# Patient Record
Sex: Female | Born: 1955 | Hispanic: Yes | Marital: Married | Smoking: Never smoker
Health system: Southern US, Community
[De-identification: ages and names within clinical notes are randomized; demographics above are authoritative.]

## PROBLEM LIST (undated history)

## (undated) HISTORY — PX: SHOULDER SURGERY: SHX246

---

## 2017-06-15 ENCOUNTER — Encounter (HOSPITAL_COMMUNITY): Payer: Self-pay | Admitting: *Deleted

## 2017-06-15 ENCOUNTER — Emergency Department (HOSPITAL_COMMUNITY)
Admission: EM | Admit: 2017-06-15 | Discharge: 2017-06-15 | Disposition: A | Discharge status: Home / Self Care | Payer: Self-pay | Attending: Emergency Medicine | Admitting: Emergency Medicine

## 2017-06-15 DIAGNOSIS — H6503 Acute serous otitis media, bilateral: Secondary | ICD-10-CM | POA: Insufficient documentation

## 2017-06-15 DIAGNOSIS — R0981 Nasal congestion: Secondary | ICD-10-CM | POA: Insufficient documentation

## 2017-06-15 NOTE — Discharge Instructions (Signed)
Please consider taking a daily allergy medication to help with your symptoms.  I suggest a less drowsy 24 hour medication such as allegra, zyrtec or Claritin or the generic version.    Please also start using nasacort  or flonase nasal sprays.    Elegir uno: Allegra, Zyrtec, Claritin Y tambien Elegir uno: Nasacort, or flonase

## 2017-06-15 NOTE — ED Triage Notes (Signed)
Pt complains of right ear pain for the past month. Pt denies hearing loss, drainage from ear, or injury to ear. Pt states it feels like something is moving inside her ear. Pt denies sinus issues.

## 2017-06-15 NOTE — ED Provider Notes (Signed)
[facility]-EMERGENCY DEPT Provider Note   CSN: [id number] Arrival date & time: 06/15/17  1722     History   Chief Complaint Chief Complaint  Patient presents with  . Otalgia    HPI Deborah Lee is a 62 y.o. female with no significant past medical history who presents today for evaluation of right ear pain.  She reports that she feels like something moving in her ear.  She reports that this has been worsening over the past month.  She does not have a primary care provider.  She denies any headaches or ringing in her ears.  No changes to her hearing.  HPI  History reviewed. No pertinent past medical history.  There are no active problems to display for this patient.   History reviewed. No pertinent surgical history.   OB History   None      Home Medications    Prior to Admission medications   Not on File    Family History No family history on file.  Social History Social History   Tobacco Use  . Smoking status: Never Smoker  . Smokeless tobacco: Never Used  Substance Use Topics  . Alcohol use: Not Currently  . Drug use: Not Currently     Allergies   Patient has no allergy information on record.   Review of Systems Review of Systems  Constitutional: Negative for chills and fever.  HENT: Positive for ear pain. Negative for congestion, ear discharge, facial swelling, hearing loss, sinus pain, sneezing and sore throat.   Respiratory: Negative for cough and shortness of breath.   Neurological: Negative for headaches.  All other systems reviewed and are negative.    Physical Exam Updated Vital Signs BP (!) 147/74 (BP Location: Left Arm)   Pulse 68   Temp 98.5 F (36.9 C) (Oral)   Resp 18   SpO2 96%   Physical Exam  Constitutional: She appears well-developed and well-nourished.  HENT:  Head: Normocephalic and atraumatic.  Right Ear: Tympanic membrane, external ear and ear canal normal.  Left Ear: Tympanic membrane,  external ear and ear canal normal.  Nose: Mucosal edema present.  Mouth/Throat: Oropharynx is clear and moist. No oropharyngeal exudate.  Bilateral serous effusions, right worse than left.  Eyes: Conjunctivae are normal.  Neck: Normal range of motion. Neck supple.  Pulmonary/Chest: Effort normal. No respiratory distress.  Lymphadenopathy:    She has no cervical adenopathy.  Neurological: She is alert.  Skin: Skin is warm and dry. She is not diaphoretic.  Nursing note and vitals reviewed.    ED Treatments / Results  Labs (all labs ordered are listed, but only abnormal results are displayed) Labs Reviewed - No data to display  EKG None  Radiology No results found.  Procedures Procedures (including critical care time)  Medications Ordered in ED Medications - No data to display   Initial Impression / Assessment and Plan / ED Course  I have reviewed the triage vital signs and the nursing notes.  Pertinent labs & imaging results that were available during my care of the patient were reviewed by me and considered in my medical decision making (see chart for details).    Patient presents today for evaluation of pain in her right ear and feeling like something is moving around which has been worsening over the past month.  Physical exam reveals bilateral serous otitis media's, right worse than left.  She also has mild mucosal edema.  Given time of year and symptom progression  most likely correlates with seasonal allergies.  She was instructed to start taking a second-generation antihistamine along with using over-the-counter steroid-based nasal sprays.  She was given follow-up with ear nose and throat if she does not have a primary care provider and instructed that if her symptoms do not start improving within 1 to 2 weeks that she may follow-up with them.  Patient was given the option to ask questions, all of which were answered to the best of my abilities.  Return precautions were  discussed.  Patient discharged home.  Hemodynamically stable at time of discharge.  Final Clinical Impressions(s) / ED Diagnoses   Final diagnoses:  Non-recurrent acute serous otitis media of both ears    ED Discharge Orders    None       Norman ClayHammond, Kristell Wooding W, PA-C 06/15/17 2302    Jacalyn LefevreHaviland, Julie, MD 06/15/17 2350

## 2017-06-15 NOTE — ED Notes (Signed)
Pt is alert an oriented x 4 and is verbally responsive Pt reports that she has rt ear discomfort that fells full/heavy. Pt does not report pain at this time.

## 2018-12-10 ENCOUNTER — Other Ambulatory Visit: Payer: Self-pay

## 2018-12-10 ENCOUNTER — Encounter (HOSPITAL_COMMUNITY): Payer: Self-pay | Admitting: *Deleted

## 2018-12-10 ENCOUNTER — Emergency Department (HOSPITAL_COMMUNITY): Payer: BC Managed Care – PPO

## 2018-12-10 ENCOUNTER — Encounter (HOSPITAL_COMMUNITY): Payer: Self-pay | Admitting: Physician Assistant

## 2018-12-10 ENCOUNTER — Observation Stay (HOSPITAL_COMMUNITY)
Admission: EM | Admit: 2018-12-10 | Discharge: 2018-12-12 | Disposition: A | Discharge status: Home / Self Care | Payer: BC Managed Care – PPO | Attending: Internal Medicine | Admitting: Internal Medicine

## 2018-12-10 ENCOUNTER — Emergency Department (HOSPITAL_COMMUNITY)
Admission: EM | Admit: 2018-12-10 | Discharge: 2018-12-10 | Disposition: A | Discharge status: Home / Self Care | Payer: BC Managed Care – PPO | Source: Home / Self Care | Attending: Emergency Medicine | Admitting: Emergency Medicine

## 2018-12-10 DIAGNOSIS — Z79899 Other long term (current) drug therapy: Secondary | ICD-10-CM | POA: Insufficient documentation

## 2018-12-10 DIAGNOSIS — M7072 Other bursitis of hip, left hip: Principal | ICD-10-CM | POA: Insufficient documentation

## 2018-12-10 DIAGNOSIS — R739 Hyperglycemia, unspecified: Secondary | ICD-10-CM | POA: Diagnosis not present

## 2018-12-10 DIAGNOSIS — R935 Abnormal findings on diagnostic imaging of other abdominal regions, including retroperitoneum: Secondary | ICD-10-CM

## 2018-12-10 DIAGNOSIS — R1032 Left lower quadrant pain: Secondary | ICD-10-CM | POA: Insufficient documentation

## 2018-12-10 DIAGNOSIS — E119 Type 2 diabetes mellitus without complications: Secondary | ICD-10-CM

## 2018-12-10 DIAGNOSIS — M7070 Other bursitis of hip, unspecified hip: Secondary | ICD-10-CM

## 2018-12-10 DIAGNOSIS — Z20828 Contact with and (suspected) exposure to other viral communicable diseases: Secondary | ICD-10-CM | POA: Diagnosis not present

## 2018-12-10 DIAGNOSIS — M25559 Pain in unspecified hip: Secondary | ICD-10-CM | POA: Diagnosis present

## 2018-12-10 LAB — CBC
HCT: 43.6 % (ref 36.0–46.0)
Hemoglobin: 14.6 g/dL (ref 12.0–15.0)
MCH: 32.7 pg (ref 26.0–34.0)
MCHC: 33.5 g/dL (ref 30.0–36.0)
MCV: 97.8 fL (ref 80.0–100.0)
Platelets: 217 10*3/uL (ref 150–400)
RBC: 4.46 MIL/uL (ref 3.87–5.11)
RDW: 12.3 % (ref 11.5–15.5)
WBC: 7.9 10*3/uL (ref 4.0–10.5)
nRBC: 0 % (ref 0.0–0.2)

## 2018-12-10 LAB — COMPREHENSIVE METABOLIC PANEL
ALT: 18 U/L (ref 0–44)
AST: 22 U/L (ref 15–41)
Albumin: 4.4 g/dL (ref 3.5–5.0)
Alkaline Phosphatase: 94 U/L (ref 38–126)
Anion gap: 10 (ref 5–15)
BUN: 15 mg/dL (ref 6–20)
CO2: 25 mmol/L (ref 22–32)
Calcium: 9.2 mg/dL (ref 8.9–10.3)
Chloride: 104 mmol/L (ref 98–111)
Creatinine, Ser: 0.63 mg/dL (ref 0.44–1.00)
GFR calc Af Amer: >60 mL/min (ref >60)
GFR calc non Af Amer: >60 mL/min (ref >60)
Glucose, Bld: 185 mg/dL — ABNORMAL HIGH (ref 70–99)
Potassium: 4.1 mmol/L (ref 3.5–5.1)
Sodium: 139 mmol/L (ref 135–145)
Total Bilirubin: 0.7 mg/dL (ref 0.3–1.2)
Total Protein: 7.3 g/dL (ref 6.5–8.1)

## 2018-12-10 LAB — URINALYSIS, ROUTINE W REFLEX MICROSCOPIC
Bilirubin Urine: NEGATIVE
Glucose, UA: NEGATIVE mg/dL
Hgb urine dipstick: NEGATIVE
Ketones, ur: NEGATIVE mg/dL
Leukocytes,Ua: NEGATIVE
Nitrite: NEGATIVE
Protein, ur: NEGATIVE mg/dL
Specific Gravity, Urine: 1.005 (ref 1.005–1.030)
pH: 6 (ref 5.0–8.0)

## 2018-12-10 LAB — LIPASE, BLOOD: Lipase: 34 U/L (ref 11–51)

## 2018-12-10 LAB — I-STAT BETA HCG BLOOD, ED (MC, WL, AP ONLY): I-stat hCG, quantitative: <5 m[IU]/mL (ref <5)

## 2018-12-10 MED ORDER — GADOBUTROL 1 MMOL/ML IV SOLN
5.0000 mL | Freq: Once | INTRAVENOUS | Status: AC | PRN
  Administered 2018-12-10: 5 mL via INTRAVENOUS

## 2018-12-10 MED ORDER — ACETAMINOPHEN 650 MG RE SUPP: 650.0000 mg | Freq: Four times a day (QID) | RECTAL | Status: DC | PRN

## 2018-12-10 MED ORDER — ACETAMINOPHEN 325 MG PO TABS: 650.0000 mg | ORAL_TABLET | Freq: Four times a day (QID) | ORAL | Status: DC | PRN

## 2018-12-10 MED ORDER — SODIUM CHLORIDE 0.9 % IV SOLN
INTRAVENOUS | Status: AC
  Administered 2018-12-11: 01:00:00 via INTRAVENOUS

## 2018-12-10 MED ORDER — SODIUM CHLORIDE 0.9% FLUSH: 3.0000 mL | Freq: Once | INTRAVENOUS | Status: DC

## 2018-12-10 MED ORDER — IOHEXOL 300 MG/ML  SOLN
100.0000 mL | Freq: Once | INTRAMUSCULAR | Status: AC | PRN
  Administered 2018-12-10: 16:00:00 100 mL via INTRAVENOUS

## 2018-12-10 MED ORDER — SODIUM CHLORIDE (PF) 0.9 % IJ SOLN
INTRAMUSCULAR | Status: AC
  Filled 2018-12-10: qty 50

## 2018-12-10 NOTE — ED Provider Notes (Signed)
Patient presents today in transfer from [place] [facility] for [place].  According to note from provider at [facility] patient was woken up this morning at about 4 AM by left abdominal and suprapubic pain.  Chart review shows that at [facility] an MRI was obtained showing concern for a left hip/psoas muscle fluid collection with the recommendation per radiology of obtaining MRI with and without contrast of the left hip for further evaluation. Physical Exam  BP 135/67   Pulse 69   Temp 98.1 F (36.7 C) (Oral)   Resp 14   SpO2 100%   Physical Exam Vitals signs and nursing note reviewed.  Constitutional:      General: She is not in acute distress.    Appearance: She is well-developed. She is not diaphoretic.  HENT:     Head: Normocephalic and atraumatic.  Eyes:     General: No scleral icterus.       Right eye: No discharge.        Left eye: No discharge.     Conjunctiva/sclera: Conjunctivae normal.  Neck:     Musculoskeletal: Normal range of motion.  Cardiovascular:     Rate and Rhythm: Normal rate and regular rhythm.  Pulmonary:     Effort: Pulmonary effort is normal. No respiratory distress.     Breath sounds: No stridor.  Abdominal:     General: There is no distension.  Musculoskeletal:        General: No deformity.  Skin:    General: Skin is warm and dry.  Neurological:     Mental Status: She is alert.     Motor: No abnormal muscle tone.     Gait: Gait normal.  Psychiatric:        Mood and Affect: Mood normal.        Behavior: Behavior normal.     ED Course/Procedures   Clinical Course as of Dec 11 6  Sat Dec 10, 2018  2134 Spoke with Ortho who recommended clarifying if in the joint or not.Radiologist reports that the fluid collection does not extend into the joint.Per discussions with Dr. Percell Miller plan to admit for IR to tap the bursa.   [EH]    Clinical Course User Index [EH] Ollen Gross    Procedures Mr Hip Left W Wo Contrast  Result Date:  12/10/2018 CLINICAL DATA:  Hip swelling, infection suspected EXAM: MRI OF THE LEFT HIP WITHOUT AND WITH CONTRAST TECHNIQUE: Multiplanar, multisequence MR imaging was performed both before and after administration of intravenous contrast. CONTRAST:  20mL GADAVIST GADOBUTROL 1 MMOL/ML IV SOLN COMPARISON:  None. FINDINGS: Bones: There is no evidence of acute fracture, dislocation or avascular necrosis. The femoral head is normal in shape, configuration and sphericity. No osseous bump at the femoral head neck junction. The visualized bony pelvis appears normal. The visualized sacroiliac joints and symphysis pubis appear normal. Articular cartilage and labrum Articular cartilage: There is mild superior joint space loss with chondral thinning. Labrum: There is no gross labral tear or paralabral abnormality. Joint or bursal effusion Joint effusion: There is a small hip joint effusion. Bursae: Extending superiorly from the superior joint space within the iliopsoas bursa there is a complex multi-septated peripherally enhancing collection. The collection measures approximately 3.6 x 2.3 x 4.7 cm. There is scattered debris seen within the collection. The collection extends to the left lesser trochanter. Muscles and tendons Muscles and tendons: The visualized gluteus, hamstring and iliopsoas tendons appear normal. The piriformis muscles appear symmetric. Other  findings Miscellaneous: The visualized internal pelvic contents appear unremarkable. IMPRESSION: Complex iliopsoas bursitis measuring approximately 3.6 x 2.3 x 4.7 cm. Electronically Signed   By: Jonna Clark M.D.   On: 12/10/2018 20:56   Ct Abdomen Pelvis W Contrast  Result Date: 12/10/2018 CLINICAL DATA:  Acute abdominal pain in the left abdomen/flank. EXAM: CT ABDOMEN AND PELVIS WITH CONTRAST TECHNIQUE: Multidetector CT imaging of the abdomen and pelvis was performed using the standard protocol following bolus administration of intravenous contrast. CONTRAST:   OMNIPAQUE IOHEXOL 300 MG/ML  SOLN COMPARISON:  None. FINDINGS: Lower chest: No significant pulmonary nodules or acute consolidative airspace disease. Hepatobiliary: Normal liver size. No liver mass. Normal gallbladder with no radiopaque cholelithiasis. No biliary ductal dilatation. Tiny periampullary duodenal diverticulum. Pancreas: Normal, with no mass or duct dilation. Spleen: Normal size. No mass. Adrenals/Urinary Tract: Normal adrenals. Subcentimeter hypodense renal cortical lesion in the upper left kidney is too small to characterize and requires no follow-up. Otherwise normal kidneys, with no hydronephrosis. Normal bladder. Stomach/Bowel: Normal non-distended stomach. Normal caliber small bowel with no small bowel wall thickening. Normal appendix. Scattered mild colonic diverticulosis, with no large bowel wall thickening or significant pericolonic fat stranding. Vascular/Lymphatic: Atherosclerotic nonaneurysmal abdominal aorta. Patent portal, splenic, hepatic and renal veins. No pathologically enlarged lymph nodes in the abdomen or pelvis. Reproductive: Mildly enlarged myomatous uterus with dominant 2.9 cm anterior left uterine fibroid. No adnexal mass. Other: No pneumoperitoneum, ascites or intra-abdominal fluid collection. Musculoskeletal: No aggressive appearing focal osseous lesions. There is a low-attenuation 3.5 x 2.1 x 4.3 cm collection along the lateral margin of left iliopsoas muscle anterior to the left femoral neck (series 2/image 69). Mild lumbar spondylosis. IMPRESSION: 1. Low-attenuation 3.5 x 2.1 x 4.3 cm collection along the lateral margin of the left iliopsoas muscle anterior to the left femoral neck, potentially a bursal collection. Infected collection cannot be excluded. MRI of the left hip without and with IV contrast may be obtained for further evaluation as clinically warranted. 2. No evidence of bowel obstruction or acute bowel inflammation. Mild colonic diverticulosis, with no evidence  of acute diverticulitis. No hydronephrosis. 3. Mildly enlarged myomatous uterus. 4.  Aortic Atherosclerosis (ICD10-I70.0). Electronically Signed   By: Delbert Phenix M.D.   On: 12/10/2018 16:20   Results for orders placed or performed during the hospital encounter of 12/10/18  Lipase, blood  Result Value Ref Range   Lipase 34 11 - 51 U/L  Comprehensive metabolic panel  Result Value Ref Range   Sodium 139 135 - 145 mmol/L   Potassium 4.1 3.5 - 5.1 mmol/L   Chloride 104 98 - 111 mmol/L   CO2 25 22 - 32 mmol/L   Glucose, Bld 185 (H) 70 - 99 mg/dL   BUN 15 6 - 20 mg/dL   Creatinine, Ser 1.61 0.44 - 1.00 mg/dL   Calcium 9.2 8.9 - 09.6 mg/dL   Total Protein 7.3 6.5 - 8.1 g/dL   Albumin 4.4 3.5 - 5.0 g/dL   AST 22 15 - 41 U/L   ALT 18 0 - 44 U/L   Alkaline Phosphatase 94 38 - 126 U/L   Total Bilirubin 0.7 0.3 - 1.2 mg/dL   GFR calc non Af Amer >60 >60 mL/min   GFR calc Af Amer >60 >60 mL/min   Anion gap 10 5 - 15  CBC  Result Value Ref Range   WBC 7.9 4.0 - 10.5 K/uL   RBC 4.46 3.87 - 5.11 MIL/uL   Hemoglobin 14.6 12.0 -  15.0 g/dL   HCT 16.143.6 09.636.0 - 04.546.0 %   MCV 97.8 80.0 - 100.0 fL   MCH 32.7 26.0 - 34.0 pg   MCHC 33.5 30.0 - 36.0 g/dL   RDW 40.912.3 81.111.5 - 91.415.5 %   Platelets 217 150 - 400 K/uL   nRBC 0.0 0.0 - 0.2 %  Urinalysis, Routine w reflex microscopic  Result Value Ref Range   Color, Urine STRAW (A) YELLOW   APPearance CLEAR CLEAR   Specific Gravity, Urine 1.005 1.005 - 1.030   pH 6.0 5.0 - 8.0   Glucose, UA NEGATIVE NEGATIVE mg/dL   Hgb urine dipstick NEGATIVE NEGATIVE   Bilirubin Urine NEGATIVE NEGATIVE   Ketones, ur NEGATIVE NEGATIVE mg/dL   Protein, ur NEGATIVE NEGATIVE mg/dL   Nitrite NEGATIVE NEGATIVE   Leukocytes,Ua NEGATIVE NEGATIVE  I-Stat beta hCG blood, ED  Result Value Ref Range   I-stat hCG, quantitative <5.0 <5 mIU/mL   Comment 3           Mr Hip Left W Wo Contrast  Result Date: 12/10/2018 CLINICAL DATA:  Hip swelling, infection suspected EXAM: MRI OF  THE LEFT HIP WITHOUT AND WITH CONTRAST TECHNIQUE: Multiplanar, multisequence MR imaging was performed both before and after administration of intravenous contrast. CONTRAST:  5mL GADAVIST GADOBUTROL 1 MMOL/ML IV SOLN COMPARISON:  None. FINDINGS: Bones: There is no evidence of acute fracture, dislocation or avascular necrosis. The femoral head is normal in shape, configuration and sphericity. No osseous bump at the femoral head neck junction. The visualized bony pelvis appears normal. The visualized sacroiliac joints and symphysis pubis appear normal. Articular cartilage and labrum Articular cartilage: There is mild superior joint space loss with chondral thinning. Labrum: There is no gross labral tear or paralabral abnormality. Joint or bursal effusion Joint effusion: There is a small hip joint effusion. Bursae: Extending superiorly from the superior joint space within the iliopsoas bursa there is a complex multi-septated peripherally enhancing collection. The collection measures approximately 3.6 x 2.3 x 4.7 cm. There is scattered debris seen within the collection. The collection extends to the left lesser trochanter. Muscles and tendons Muscles and tendons: The visualized gluteus, hamstring and iliopsoas tendons appear normal. The piriformis muscles appear symmetric. Other findings Miscellaneous: The visualized internal pelvic contents appear unremarkable. IMPRESSION: Complex iliopsoas bursitis measuring approximately 3.6 x 2.3 x 4.7 cm. Electronically Signed   By: Jonna ClarkBindu  Avutu M.D.   On: 12/10/2018 20:56   Ct Abdomen Pelvis W Contrast  Result Date: 12/10/2018 CLINICAL DATA:  Acute abdominal pain in the left abdomen/flank. EXAM: CT ABDOMEN AND PELVIS WITH CONTRAST TECHNIQUE: Multidetector CT imaging of the abdomen and pelvis was performed using the standard protocol following bolus administration of intravenous contrast. CONTRAST:  100mL OMNIPAQUE IOHEXOL 300 MG/ML  SOLN COMPARISON:  None. FINDINGS: Lower  chest: No significant pulmonary nodules or acute consolidative airspace disease. Hepatobiliary: Normal liver size. No liver mass. Normal gallbladder with no radiopaque cholelithiasis. No biliary ductal dilatation. Tiny periampullary duodenal diverticulum. Pancreas: Normal, with no mass or duct dilation. Spleen: Normal size. No mass. Adrenals/Urinary Tract: Normal adrenals. Subcentimeter hypodense renal cortical lesion in the upper left kidney is too small to characterize and requires no follow-up. Otherwise normal kidneys, with no hydronephrosis. Normal bladder. Stomach/Bowel: Normal non-distended stomach. Normal caliber small bowel with no small bowel wall thickening. Normal appendix. Scattered mild colonic diverticulosis, with no large bowel wall thickening or significant pericolonic fat stranding. Vascular/Lymphatic: Atherosclerotic nonaneurysmal abdominal aorta. Patent portal, splenic, hepatic and renal veins. No  pathologically enlarged lymph nodes in the abdomen or pelvis. Reproductive: Mildly enlarged myomatous uterus with dominant 2.9 cm anterior left uterine fibroid. No adnexal mass. Other: No pneumoperitoneum, ascites or intra-abdominal fluid collection. Musculoskeletal: No aggressive appearing focal osseous lesions. There is a low-attenuation 3.5 x 2.1 x 4.3 cm collection along the lateral margin of left iliopsoas muscle anterior to the left femoral neck (series 2/image 69). Mild lumbar spondylosis. IMPRESSION: 1. Low-attenuation 3.5 x 2.1 x 4.3 cm collection along the lateral margin of the left iliopsoas muscle anterior to the left femoral neck, potentially a bursal collection. Infected collection cannot be excluded. MRI of the left hip without and with IV contrast may be obtained for further evaluation as clinically warranted. 2. No evidence of bowel obstruction or acute bowel inflammation. Mild colonic diverticulosis, with no evidence of acute diverticulitis. No hydronephrosis. 3. Mildly enlarged  myomatous uterus. 4.  Aortic Atherosclerosis (ICD10-I70.0). Electronically Signed   By: Delbert Phenix M.D.   On: 12/10/2018 16:20    MDM  Patient presents in transfer from [place] for MRI of her left hip after an abnormal CT scan.  MRI was obtained showing concern for a complex iliopsoas bursitis.  I spoke with on-call orthopedics Dr. Wandra Feinstein who states that as it is not in the joint it would be appropriate for IR to tap the fluid collection to evaluate for infection.  As IR is not available at this time of night patient will be admitted for procedure tomorrow.  I spoke with Dr. Selena Batten who agreed to admit the patient.  Patient remained hemodynamically stable while in my care.       Cristina Gong, PA-C 12/11/18 0010    Tegeler, Canary Brim, MD 12/11/18 [phone]

## 2018-12-10 NOTE — ED Triage Notes (Addendum)
Woke around 4 am with pain in left abd/flank. No other symptoms associated with symptoms. History of UTI,   [facility] # [id number]

## 2018-12-10 NOTE — ED Notes (Signed)
ED Provider at bedside. 

## 2018-12-10 NOTE — H&P (Addendum)
TRH H&P    Patient Demographics:    Deborah Lee, is a 63 y.o. female  MRN: [id number]  DOB - 10/31/1955  Admit Date - 12/10/2018  Referring MD/NP/PA:  Lyndel SafeElizabeth Hammond  Outpatient Primary MD for the patient is Patient, No Pcp Per  Patient coming from:  home  Chief complaint-  Left lower quadrant pain   HPI:    Deborah Lee  is a 63 y.o. female, w left lower quadrant pain since yesterday am.  Pt denies fever, chills, cough, cp, palp, n/v, diarrhea, brbpr, black stool, dysuria, hematuria.  Pt [facility]  CT abd/ pelvis IMPRESSION: 1. Low-attenuation 3.5 x 2.1 x 4.3 cm collection along the lateral margin of the left iliopsoas muscle anterior to the left femoral neck, potentially a bursal collection. Infected collection cannot be excluded. MRI of the left hip without and with IV contrast may be obtained for further evaluation as clinically warranted. 2. No evidence of bowel obstruction or acute bowel inflammation. Mild colonic diverticulosis, with no evidence of acute diverticulitis. No hydronephrosis. 3. Mildly enlarged myomatous uterus. 4.  Aortic Atherosclerosis (ICD10-I70.0).  [facility]   MRI L hip IMPRESSION: Complex iliopsoas bursitis measuring approximately 3.6 x 2.3 x 4.7 Cm.  Orthopedics consulted by ED, Dr. Eulah PontMurphy recommended IR sampling of fluid collection per ED  Pt will be admitted complex iliopsoas bursitis    Review of systems:    In addition to the HPI above,  No Fever-chills, No Headache, No changes with Vision or hearing, No problems swallowing food or Liquids, No Chest pain, Cough or Shortness of Breath, No Abdominal pain, No Nausea or Vomiting, bowel movements are regular, No Blood in stool or Urine, No dysuria, No new skin rashes or bruises,   No new weakness, tingling, numbness in any extremity, No  recent weight gain or loss, No polyuria, polydypsia or polyphagia, No significant Mental Stressors.  All other systems reviewed and are negative.    Past History of the following :    History reviewed. No pertinent past medical history.    Past Surgical History:  Procedure Laterality Date  . CESAREAN SECTION    . SHOULDER SURGERY        Social History:      Social History   Tobacco Use  . Smoking status: Never Smoker  . Smokeless tobacco: Never Used  Substance Use Topics  . Alcohol use: Not Currently       Family History :     Family History  Problem Relation Age of Onset  . Diabetes Mother        Home Medications:   Prior to Admission medications   Medication Sig Start Date End Date Taking? Authorizing Provider  QUEtiapine (SEROQUEL) 100 MG tablet Take 100 mg by mouth at bedtime.  11/24/18  Yes [provider]     Allergies:    No Known Allergies   Physical Exam:   Vitals  Blood pressure 135/67, pulse 69, temperature 98.1  F (36.7 C), temperature source Oral, resp. rate 14, SpO2 100 %.  1.  General: axoxo3  2. Psychiatric: euthymic  3. Neurologic: cn2-12 intact, reflexes 2+ symmetric, diffuse with no clonus, motor 5/5 in all 4 ext  4. HEENMT:  Anicteric, pupils 1.24mm symmetric, direct, consensual, intact Neck: no jvd  5. Respiratory : CTAB  6. Cardiovascular : rrr s1, s2, no m/g/r  7. Gastrointestinal:  Abd: soft, nt, nd, +bs  8. Skin:  Ext: no c/c/e, no rash Very small 1cm ? bruise over the left hip No fluctuance , no tenderness, no reddness or warmth  9.Musculoskeletal:  Good ROM    Data Review:    CBC Recent Labs  Lab 12/10/18 0802  WBC 7.9  HGB 14.6  HCT 43.6  PLT 217  MCV 97.8  MCH 32.7  MCHC 33.5  RDW 12.3   ------------------------------------------------------------------------------------------------------------------  Results for orders placed or performed during the hospital encounter of  12/10/18 (from the past 48 hour(s))  Urinalysis, Routine w reflex microscopic     Status: Abnormal   Collection Time: 12/10/18  7:47 AM  Result Value Ref Range   Color, Urine STRAW (A) YELLOW   APPearance CLEAR CLEAR   Specific Gravity, Urine 1.005 1.005 - 1.030   pH 6.0 5.0 - 8.0   Glucose, UA NEGATIVE NEGATIVE mg/dL   Hgb urine dipstick NEGATIVE NEGATIVE   Bilirubin Urine NEGATIVE NEGATIVE   Ketones, ur NEGATIVE NEGATIVE mg/dL   Protein, ur NEGATIVE NEGATIVE mg/dL   Nitrite NEGATIVE NEGATIVE   Leukocytes,Ua NEGATIVE NEGATIVE    Comment: Performed at [facility], [address] [place], [place] [address]  Lipase, blood     Status: None   Collection Time: 12/10/18  8:02 AM  Result Value Ref Range   Lipase 34 11 - 51 U/L    Comment: Performed at [facility], [address] [place], [place] [address]  Comprehensive metabolic panel     Status: Abnormal   Collection Time: 12/10/18  8:02 AM  Result Value Ref Range   Sodium 139 135 - 145 mmol/L   Potassium 4.1 3.5 - 5.1 mmol/L   Chloride 104 98 - 111 mmol/L   CO2 25 22 - 32 mmol/L   Glucose, Bld 185 (H) 70 - 99 mg/dL   BUN 15 6 - 20 mg/dL   Creatinine, Ser 6.27 0.44 - 1.00 mg/dL   Calcium 9.2 8.9 - 03.5 mg/dL   Total Protein 7.3 6.5 - 8.1 g/dL   Albumin 4.4 3.5 - 5.0 g/dL   AST 22 15 - 41 U/L   ALT 18 0 - 44 U/L   Alkaline Phosphatase 94 38 - 126 U/L   Total Bilirubin 0.7 0.3 - 1.2 mg/dL   GFR calc non Af Amer >60 >60 mL/min   GFR calc Af Amer >60 >60 mL/min   Anion gap 10 5 - 15    Comment: Performed at [facility], [address] [place], [place] [address]  CBC     Status: None   Collection Time: 12/10/18  8:02 AM  Result Value Ref Range   WBC 7.9 4.0 - 10.5 K/uL   RBC 4.46 3.87 - 5.11 MIL/uL   Hemoglobin 14.6 12.0 - 15.0 g/dL   HCT 18.2 99.3 - 71.6 %   MCV 97.8 80.0 - 100.0 fL   MCH 32.7 26.0 - 34.0 pg   MCHC 33.5 30.0 - 36.0 g/dL   RDW 96.7 89.3 - 81.0 %  Platelets 217 150 - 400 K/uL   nRBC 0.0 0.0 - 0.2 %    Comment: Performed at [facility], [address], [place], [place] [address]  I-Stat beta hCG blood, ED     Status: None   Collection Time: 12/10/18  8:16 AM  Result Value Ref Range   I-stat hCG, quantitative <5.0 <5 mIU/mL   Comment 3            Comment:   GEST. AGE      CONC.  (mIU/mL)   <=1 WEEK        5 - 50     2 WEEKS       50 - 500     3 WEEKS       100 - 10,000     4 WEEKS     1,000 - 30,000        FEMALE AND NON-PREGNANT FEMALE:     LESS THAN 5 mIU/mL     Chemistries  Recent Labs  Lab 12/10/18 0802  NA 139  K 4.1  CL 104  CO2 25  GLUCOSE 185*  BUN 15  CREATININE 0.63  CALCIUM 9.2  AST 22  ALT 18  ALKPHOS 94  BILITOT 0.7   ------------------------------------------------------------------------------------------------------------------  ------------------------------------------------------------------------------------------------------------------ GFR: CrCl cannot be calculated (Unknown ideal weight.). Liver Function Tests: Recent Labs  Lab 12/10/18 0802  AST 22  ALT 18  ALKPHOS 94  BILITOT 0.7  PROT 7.3  ALBUMIN 4.4   Recent Labs  Lab 12/10/18 0802  LIPASE 34   No results for input(s): AMMONIA in the last 168 hours. Coagulation Profile: No results for input(s): INR, PROTIME in the last 168 hours. Cardiac Enzymes: No results for input(s): CKTOTAL, CKMB, CKMBINDEX, TROPONINI in the last 168 hours. BNP (last 3 results) No results for input(s): PROBNP in the last 8760 hours. HbA1C: No results for input(s): HGBA1C in the last 72 hours. CBG: No results for input(s): GLUCAP in the last 168 hours. Lipid Profile: No results for input(s): CHOL, HDL, LDLCALC, TRIG, CHOLHDL, LDLDIRECT in the last 72 hours. Thyroid Function Tests: No results for input(s): TSH, T4TOTAL, FREET4, T3FREE, THYROIDAB in the last 72 hours. Anemia Panel: No results for input(s): VITAMINB12,  FOLATE, FERRITIN, TIBC, IRON, RETICCTPCT in the last 72 hours.  --------------------------------------------------------------------------------------------------------------- Urine analysis:    Component Value Date/Time   COLORURINE STRAW (A) 12/10/2018 0747   APPEARANCEUR CLEAR 12/10/2018 0747   LABSPEC 1.005 12/10/2018 0747   PHURINE 6.0 12/10/2018 0747   GLUCOSEU NEGATIVE 12/10/2018 0747   HGBUR NEGATIVE 12/10/2018 0747   BILIRUBINUR NEGATIVE 12/10/2018 0747   KETONESUR NEGATIVE 12/10/2018 0747   PROTEINUR NEGATIVE 12/10/2018 0747   NITRITE NEGATIVE 12/10/2018 0747   LEUKOCYTESUR NEGATIVE 12/10/2018 0747      Imaging Results:    Mr Hip Left W Wo Contrast  Result Date: 12/10/2018 CLINICAL DATA:  Hip swelling, infection suspected EXAM: MRI OF THE LEFT HIP WITHOUT AND WITH CONTRAST TECHNIQUE: Multiplanar, multisequence MR imaging was performed both before and after administration of intravenous contrast. CONTRAST:  5mL GADAVIST GADOBUTROL 1 MMOL/ML IV SOLN COMPARISON:  None. FINDINGS: Bones: There is no evidence of acute fracture, dislocation or avascular necrosis. The femoral head is normal in shape, configuration and sphericity. No osseous bump at the femoral head neck junction. The visualized bony pelvis appears normal. The visualized sacroiliac joints and symphysis pubis appear normal. Articular cartilage and labrum Articular cartilage: There is mild superior joint space loss with chondral thinning. Labrum:  There is no gross labral tear or paralabral abnormality. Joint or bursal effusion Joint effusion: There is a small hip joint effusion. Bursae: Extending superiorly from the superior joint space within the iliopsoas bursa there is a complex multi-septated peripherally enhancing collection. The collection measures approximately 3.6 x 2.3 x 4.7 cm. There is scattered debris seen within the collection. The collection extends to the left lesser trochanter. Muscles and tendons Muscles and  tendons: The visualized gluteus, hamstring and iliopsoas tendons appear normal. The piriformis muscles appear symmetric. Other findings Miscellaneous: The visualized internal pelvic contents appear unremarkable. IMPRESSION: Complex iliopsoas bursitis measuring approximately 3.6 x 2.3 x 4.7 cm. Electronically Signed   By: Jonna Clark M.D.   On: 12/10/2018 20:56   Ct Abdomen Pelvis W Contrast  Result Date: 12/10/2018 CLINICAL DATA:  Acute abdominal pain in the left abdomen/flank. EXAM: CT ABDOMEN AND PELVIS WITH CONTRAST TECHNIQUE: Multidetector CT imaging of the abdomen and pelvis was performed using the standard protocol following bolus administration of intravenous contrast. CONTRAST:  OMNIPAQUE IOHEXOL 300 MG/ML  SOLN COMPARISON:  None. FINDINGS: Lower chest: No significant pulmonary nodules or acute consolidative airspace disease. Hepatobiliary: Normal liver size. No liver mass. Normal gallbladder with no radiopaque cholelithiasis. No biliary ductal dilatation. Tiny periampullary duodenal diverticulum. Pancreas: Normal, with no mass or duct dilation. Spleen: Normal size. No mass. Adrenals/Urinary Tract: Normal adrenals. Subcentimeter hypodense renal cortical lesion in the upper left kidney is too small to characterize and requires no follow-up. Otherwise normal kidneys, with no hydronephrosis. Normal bladder. Stomach/Bowel: Normal non-distended stomach. Normal caliber small bowel with no small bowel wall thickening. Normal appendix. Scattered mild colonic diverticulosis, with no large bowel wall thickening or significant pericolonic fat stranding. Vascular/Lymphatic: Atherosclerotic nonaneurysmal abdominal aorta. Patent portal, splenic, hepatic and renal veins. No pathologically enlarged lymph nodes in the abdomen or pelvis. Reproductive: Mildly enlarged myomatous uterus with dominant 2.9 cm anterior left uterine fibroid. No adnexal mass. Other: No pneumoperitoneum, ascites or intra-abdominal fluid  collection. Musculoskeletal: No aggressive appearing focal osseous lesions. There is a low-attenuation 3.5 x 2.1 x 4.3 cm collection along the lateral margin of left iliopsoas muscle anterior to the left femoral neck (series 2/image 69). Mild lumbar spondylosis. IMPRESSION: 1. Low-attenuation 3.5 x 2.1 x 4.3 cm collection along the lateral margin of the left iliopsoas muscle anterior to the left femoral neck, potentially a bursal collection. Infected collection cannot be excluded. MRI of the left hip without and with IV contrast may be obtained for further evaluation as clinically warranted. 2. No evidence of bowel obstruction or acute bowel inflammation. Mild colonic diverticulosis, with no evidence of acute diverticulitis. No hydronephrosis. 3. Mildly enlarged myomatous uterus. 4.  Aortic Atherosclerosis (ICD10-I70.0). Electronically Signed   By: Delbert Phenix M.D.   On: 12/10/2018 16:20       Assessment & Plan:    Principal Problem:   Iliopsoas bursitis Active Problems:   Hip pain   Left lower quadrant pain  L Iliopsoas bursitis IR to aspirate as per orthopedic recommendations Please send aspirate for gram stain, culture, crystal, and afb No abx for now, no sign of sepsis or infection (warmth, redness, tenderness) Orthopedics consulted by ED, appreciate input  LLQ pain Unclear etiology Consider GI consultation if persistent  Hyperglycemia  Check hga1c, if >6.5 please start fsbs ac and qhs, ISS, and change to diabetic diet  DVT Prophylaxis-   Lovenox - SCDs   AM Labs Ordered, also please review Full Orders  Family Communication: Admission, patients condition  and plan of care including tests being ordered have been discussed with the patient  who indicate understanding and agree with the plan and Code Status.  Code Status: FULL CODE per patient  Admission status: Observation : Based on patients clinical presentation and evaluation of above clinical data, I have made determination  that patient meets observation criteria at this time.  Time spent in minutes :  55 minutes   Jani Gravel M.D on 12/11/2018 at 1:12 AM

## 2018-12-10 NOTE — ED Notes (Signed)
Patient transported to MRI 

## 2018-12-10 NOTE — Discharge Instructions (Signed)
Please go to [facility] ED. I spoke with Dr. Francia Greaves in the ED who knows you are coming and they will get an MRI to further evaluate the fluid collection.

## 2018-12-10 NOTE — ED Provider Notes (Signed)
[facility] DEPT Provider Note   CSN: [id number] Arrival date & time: 12/10/18  2440     History   Chief Complaint Chief Complaint  Patient presents with  . Abdominal Pain    HPI Deborah Lee is a 63 y.o. female.     HPI   63 year old female, no significant medical history, presents with abdominal pain.  Patient states pain woke her up around 4 AM this morning in the left abdomen and suprapubic regions.  She states that symptoms have significantly improved over the day.  She notes that she has a history of gastritis from taking medication.  She denies any nausea, vomiting, diarrhea, hematemesis, hematochezia, melena.  She denies any fevers, chills, dysuria, urinary urgency.  She denies any aggravating or alleviating factors of her pain.  She denies any associated chest pain or shortness of breath.   History reviewed. No pertinent past medical history.  There are no active problems to display for this patient.   Past Surgical History:  Procedure Laterality Date  . CESAREAN SECTION    . SHOULDER SURGERY       OB History   No obstetric history on file.      Home Medications    Prior to Admission medications   Medication Sig Start Date End Date Taking? Authorizing Provider  QUEtiapine (SEROQUEL) 100 MG tablet Take 100 mg by mouth at bedtime.  11/24/18  Yes [provider]    Family History No family history on file.  Social History Social History   Tobacco Use  . Smoking status: Never Smoker  . Smokeless tobacco: Never Used  Substance Use Topics  . Alcohol use: Not Currently  . Drug use: Not Currently     Allergies   Patient has no known allergies.   Review of Systems Review of Systems  Constitutional: Negative for chills and fever.  HENT: Negative for rhinorrhea and sore throat.   Eyes: Negative for visual disturbance.  Respiratory: Negative for cough and shortness of breath.   Cardiovascular: Negative for  chest pain and leg swelling.  Gastrointestinal: Positive for abdominal pain. Negative for diarrhea, nausea and vomiting.  Genitourinary: Negative for dysuria, frequency and urgency.  Musculoskeletal: Negative for joint swelling and neck pain.  Skin: Negative for rash and wound.  Neurological: Negative for syncope and numbness.  All other systems reviewed and are negative.    Physical Exam Updated Vital Signs BP (!) 149/71 (BP Location: Right Arm)   Pulse 70   Temp 98 F (36.7 C) (Oral)   Resp 16   SpO2 100%   Physical Exam Vitals signs and nursing note reviewed.  Constitutional:      Appearance: She is well-developed.  HENT:     Head: Normocephalic and atraumatic.  Eyes:     Conjunctiva/sclera: Conjunctivae normal.  Neck:     Musculoskeletal: Neck supple.  Cardiovascular:     Rate and Rhythm: Normal rate and regular rhythm.     Heart sounds: Normal heart sounds. No murmur.  Pulmonary:     Effort: Pulmonary effort is normal. No respiratory distress.     Breath sounds: Normal breath sounds. No wheezing or rales.  Abdominal:     General: Bowel sounds are normal. There is no distension.     Palpations: Abdomen is soft.     Tenderness: There is abdominal tenderness in the periumbilical area and left upper quadrant. There is no right CVA tenderness, left CVA tenderness or guarding. Negative signs include Murphy's sign  and McBurney's sign.  Musculoskeletal: Normal range of motion.        General: No tenderness or deformity.  Skin:    General: Skin is warm and dry.     Findings: No erythema or rash.  Neurological:     Mental Status: She is alert and oriented to person, place, and time.  Psychiatric:        Behavior: Behavior normal.      ED Treatments / Results  Labs (all labs ordered are listed, but only abnormal results are displayed) Labs Reviewed  COMPREHENSIVE METABOLIC PANEL - Abnormal; Notable for the following components:      Result Value   Glucose, Bld 185  (*)    All other components within normal limits  URINALYSIS, ROUTINE W REFLEX MICROSCOPIC - Abnormal; Notable for the following components:   Color, Urine STRAW (*)    All other components within normal limits  LIPASE, BLOOD  CBC  I-STAT BETA HCG BLOOD, ED ([facility], [facility], [facility] ONLY)    EKG None  Radiology No results found.  Procedures Procedures (including critical care time)  Medications Ordered in ED Medications  sodium chloride flush (NS) 0.9 % injection 3 mL (has no administration in time range)     Initial Impression / Assessment and Plan / ED Course  I have reviewed the triage vital signs and the nursing notes.  Pertinent labs & imaging results that were available during my care of the patient were reviewed by me and considered in my medical decision making (see chart for details).       Patient presents with a left-sided abdominal pain that started approximately 4 AM.  She denies any associated symptoms.  Vital signs stable.  On physical exam she has some tenderness in the left upper quadrant and periumbilical regions.  Her blood work shows no elevation white count, normal kidney function.  Her urine shows no evidence of a UTI.  Her CT came back with a fluid collection by the left iliopsoas muscle.  Recommendation for MRI of the left hip with and without IV contrast.  Discussed with patient and she was agreeable with MRI.  Unfortunately MRI is not available at [facility] today.  She will need transfer to [facility] for MRI.  Spoke with attending, Dr. Rodena Medin who accepted the patient to the ED.  Discussed with patient and she would like to go POV.  Will wrap up her IV site and she may go private vehicle.  Discussed with attending at [facility], Dr. Ranae Palms who is agreeable with plan.  Final Clinical Impressions(s) / ED Diagnoses   Final diagnoses:  None    ED Discharge Orders    None       Rueben Bash 12/10/18 2102    Loren Racer, MD 12/14/18  2117

## 2018-12-10 NOTE — ED Notes (Signed)
Pt provided directions and ambulated to the POV. Husband transporting pt to [facility] at this time. PIV in place and wrapped for protection. Agricultural consultant at [employer] notified that pt is coming.

## 2018-12-11 ENCOUNTER — Observation Stay (HOSPITAL_COMMUNITY): Payer: BC Managed Care – PPO

## 2018-12-11 ENCOUNTER — Encounter (HOSPITAL_COMMUNITY): Payer: Self-pay | Admitting: Internal Medicine

## 2018-12-11 DIAGNOSIS — M7072 Other bursitis of hip, left hip: Secondary | ICD-10-CM | POA: Diagnosis not present

## 2018-12-11 DIAGNOSIS — R1032 Left lower quadrant pain: Secondary | ICD-10-CM | POA: Diagnosis not present

## 2018-12-11 DIAGNOSIS — E119 Type 2 diabetes mellitus without complications: Secondary | ICD-10-CM | POA: Diagnosis not present

## 2018-12-11 DIAGNOSIS — M7070 Other bursitis of hip, unspecified hip: Secondary | ICD-10-CM

## 2018-12-11 LAB — COMPREHENSIVE METABOLIC PANEL
ALT: 17 U/L (ref 0–44)
AST: 23 U/L (ref 15–41)
Albumin: 3.4 g/dL — ABNORMAL LOW (ref 3.5–5.0)
Alkaline Phosphatase: 75 U/L (ref 38–126)
Anion gap: 10 (ref 5–15)
BUN: 11 mg/dL (ref 6–20)
CO2: 26 mmol/L (ref 22–32)
Calcium: 8.9 mg/dL (ref 8.9–10.3)
Chloride: 102 mmol/L (ref 98–111)
Creatinine, Ser: 0.72 mg/dL (ref 0.44–1.00)
GFR calc Af Amer: >60 mL/min (ref >60)
GFR calc non Af Amer: >60 mL/min (ref >60)
Glucose, Bld: 298 mg/dL — ABNORMAL HIGH (ref 70–99)
Potassium: 3.5 mmol/L (ref 3.5–5.1)
Sodium: 138 mmol/L (ref 135–145)
Total Bilirubin: 0.5 mg/dL (ref 0.3–1.2)
Total Protein: 6.2 g/dL — ABNORMAL LOW (ref 6.5–8.1)

## 2018-12-11 LAB — GLUCOSE, CAPILLARY
Glucose-Capillary: 116 mg/dL — ABNORMAL HIGH (ref 70–99)
Glucose-Capillary: 175 mg/dL — ABNORMAL HIGH (ref 70–99)
Glucose-Capillary: 135 mg/dL — ABNORMAL HIGH (ref 70–99)

## 2018-12-11 LAB — CBC
HCT: 39.7 % (ref 36.0–46.0)
Hemoglobin: 13.8 g/dL (ref 12.0–15.0)
MCH: 33.1 pg (ref 26.0–34.0)
MCHC: 34.8 g/dL (ref 30.0–36.0)
MCV: 95.2 fL (ref 80.0–100.0)
Platelets: 202 10*3/uL (ref 150–400)
RBC: 4.17 MIL/uL (ref 3.87–5.11)
RDW: 12.4 % (ref 11.5–15.5)
WBC: 5.4 10*3/uL (ref 4.0–10.5)
nRBC: 0 % (ref 0.0–0.2)

## 2018-12-11 LAB — PROTIME-INR
INR: 1.2 (ref 0.8–1.2)
Prothrombin Time: 14.8 seconds (ref 11.4–15.2)

## 2018-12-11 LAB — HEMOGLOBIN A1C
Hgb A1c MFr Bld: 7.8 % — ABNORMAL HIGH (ref 4.8–5.6)
Mean Plasma Glucose: 177.16 mg/dL

## 2018-12-11 LAB — SARS CORONAVIRUS 2 (TAT 6-24 HRS): SARS Coronavirus 2: NEGATIVE

## 2018-12-11 LAB — HIV ANTIBODY (ROUTINE TESTING W REFLEX): HIV Screen 4th Generation wRfx: NONREACTIVE

## 2018-12-11 MED ORDER — LIVING WELL WITH DIABETES BOOK - IN SPANISH
Freq: Once | Status: DC
  Filled 2018-12-11: qty 1

## 2018-12-11 MED ORDER — INSULIN ASPART 100 UNIT/ML ~~LOC~~ SOLN
0.0000 [IU] | Freq: Three times a day (TID) | SUBCUTANEOUS | Status: DC
  Administered 2018-12-11 – 2018-12-12 (×2): 2 [IU] via SUBCUTANEOUS

## 2018-12-11 MED ORDER — LIDOCAINE HCL (PF) 1 % IJ SOLN
INTRAMUSCULAR | Status: AC
  Filled 2018-12-11: qty 30

## 2018-12-11 MED ORDER — QUETIAPINE FUMARATE 100 MG PO TABS
100.0000 mg | ORAL_TABLET | Freq: Every day | ORAL | Status: DC
  Filled 2018-12-11: qty 1

## 2018-12-11 NOTE — Progress Notes (Signed)
PROGRESS NOTE  Deborah Lee YIR:[id number] DOB: 07-23-1955 DOA: 12/10/2018 PCP: Patient, No Pcp Per  HPI/Recap of past 24 hours: HPI from Dr Deborah Lee Deborah Lee  is a 63 y.o. female, w left lower quadrant pain for 1 day duration.  Denied any other associated symptoms.  CT of the abdomen/pelvis showed iliopsoas bursitis, patient was transferred from [facility] to [facility] for MRI which showed similar findings.  Patient denies any pain in her left hip, no fever/chills, denies injuring it.  No other symptoms.  Patient admitted for further management.    Today, patient denies any new complaints, reported her abdominal pain has resolved.  Noted some mild left hip swelling.  Patient denies any fever/chills, chest pain, shortness of breath, left hip pain.  Patient very worried about hospital bills and if her insurance will cover her hospital stay.  Case manager consulted   Assessment/Plan: Principal Problem:   Iliopsoas bursitis Active Problems:   Hip pain   Left lower quadrant pain  Complex iliopsoas bursitis on the left Asymptomatic Afebrile, with no leukocytosis MRI L hip showed complex iliopsoas bursitis measuring approximately 3.6 x 2.3 x 4.7 cm Orthopedics on board, no surgical indication, recommend IR aspiration and antibiotic therapy IR attempted aspiration of left hip fluid collection, culture report pending Due to no significant signs of infection, will hold off antibiotics pending culture report  Likely newly diagnosed diabetes mellitus type 2 A1c noted to be 7.8 Not on any medication at home SSI, hypoglycemic protocol, [employer] DM coordinator consulted        Malnutrition Type:      Malnutrition Characteristics:      Nutrition Interventions:       There is no height or weight on file to calculate BMI.     Code Status: Full  Family Communication: Husband at bedside  Disposition Plan: Likely  home   Consultants:  Orthopedics  IR  Procedures:  Left hip fluid aspiration  Antimicrobials:  None  DVT prophylaxis: SCD   Objective: Vitals:   12/10/18 2214 12/11/18 0900 12/11/18 1105 12/11/18 1125  BP: 135/67 125/79 130/81 121/73  Pulse: 69 80    Resp: 14 18    Temp: 98.1 F (36.7 C) 98 F (36.7 C)    TempSrc: Oral     SpO2: 100% 98%      Intake/Output Summary (Last 24 hours) at 12/11/2018 1223 Last data filed at 12/11/2018 1036 Gross per 24 hour  Intake 609.2 ml  Output 0 ml  Net 609.2 ml   There were no vitals filed for this visit.  Exam:  General: NAD   Cardiovascular: S1, S2 present  Respiratory: CTAB  Abdomen: Soft, nontender, nondistended, bowel sounds present  Musculoskeletal: No bilateral pedal edema noted, left hip with no erythema, warmth or swelling  Skin: Normal  Psychiatry: Normal mood   Data Reviewed: CBC: Recent Labs  Lab 12/10/18 0802 12/11/18 0141  WBC 7.9 5.4  HGB 14.6 13.8  HCT 43.6 39.7  MCV 97.8 95.2  PLT 217 500   Basic Metabolic Panel: Recent Labs  Lab 12/10/18 0802 12/11/18 0141  NA 139 138  K 4.1 3.5  CL 104 102  CO2 25 26  GLUCOSE 185* 298*  BUN 15 11  CREATININE 0.63 0.72  CALCIUM 9.2 8.9   GFR: CrCl cannot be calculated (Unknown ideal weight.). Liver Function Tests: Recent Labs  Lab 12/10/18 0802 12/11/18 0141  AST 22 23  ALT 18 17  ALKPHOS 94 75  BILITOT 0.7 0.5  PROT 7.3 6.2*  ALBUMIN 4.4 3.4*   Recent Labs  Lab 12/10/18 0802  LIPASE 34   No results for input(s): AMMONIA in the last 168 hours. Coagulation Profile: Recent Labs  Lab 12/11/18 0141  INR 1.2   Cardiac Enzymes: No results for input(s): CKTOTAL, CKMB, CKMBINDEX, TROPONINI in the last 168 hours. BNP (last 3 results) No results for input(s): PROBNP in the last 8760 hours. HbA1C: Recent Labs    12/11/18 0141  HGBA1C 7.8*   CBG: Recent Labs  Lab 12/11/18 0808  GLUCAP 116*   Lipid Profile: No results  for input(s): CHOL, HDL, LDLCALC, TRIG, CHOLHDL, LDLDIRECT in the last 72 hours. Thyroid Function Tests: No results for input(s): TSH, T4TOTAL, FREET4, T3FREE, THYROIDAB in the last 72 hours. Anemia Panel: No results for input(s): VITAMINB12, FOLATE, FERRITIN, TIBC, IRON, RETICCTPCT in the last 72 hours. Urine analysis:    Component Value Date/Time   COLORURINE STRAW (A) 12/10/2018 0747   APPEARANCEUR CLEAR 12/10/2018 0747   LABSPEC 1.005 12/10/2018 0747   PHURINE 6.0 12/10/2018 0747   GLUCOSEU NEGATIVE 12/10/2018 0747   HGBUR NEGATIVE 12/10/2018 0747   BILIRUBINUR NEGATIVE 12/10/2018 0747   KETONESUR NEGATIVE 12/10/2018 0747   PROTEINUR NEGATIVE 12/10/2018 0747   NITRITE NEGATIVE 12/10/2018 0747   LEUKOCYTESUR NEGATIVE 12/10/2018 0747   Sepsis Labs: @LABRCNTIP (procalcitonin:4,lacticidven:4)  ) Recent Results (from the past 240 hour(s))  SARS CORONAVIRUS 2 (TAT 6-24 HRS) Nasopharyngeal Nasopharyngeal Swab     Status: None   Collection Time: 12/10/18 10:56 PM   Specimen: Nasopharyngeal Swab  Result Value Ref Range Status   SARS Coronavirus 2 NEGATIVE NEGATIVE Final    Comment: (NOTE) SARS-CoV-2 target nucleic acids are NOT DETECTED. The SARS-CoV-2 RNA is generally detectable in upper and lower respiratory specimens during the acute phase of infection. Negative results do not preclude SARS-CoV-2 infection, do not rule out co-infections with other pathogens, and should not be used as the sole basis for treatment or other patient management decisions. Negative results must be combined with clinical observations, patient history, and epidemiological information. The expected result is Negative. Fact Sheet for Patients: 02/09/19 Fact Sheet for Healthcare Providers: HairSlick.no This test is not yet approved or cleared by the quierodirigir.com FDA and  has been authorized for detection and/or diagnosis of SARS-CoV-2  by FDA under an Emergency Use Authorization (EUA). This EUA will remain  in effect (meaning this test can be used) for the duration of the COVID-19 declaration under Section 56 4(b)(1) of the Act, 21 U.S.C. section 360bbb-3(b)(1), unless the authorization is terminated or revoked sooner. Performed at [facility], [address], [place], [place]       Studies: Mr Hip Left W Wo Contrast  Result Date: 12/10/2018 CLINICAL DATA:  Hip swelling, infection suspected EXAM: MRI OF THE LEFT HIP WITHOUT AND WITH CONTRAST TECHNIQUE: Multiplanar, multisequence MR imaging was performed both before and after administration of intravenous contrast. CONTRAST:  67mL GADAVIST GADOBUTROL 1 MMOL/ML IV SOLN COMPARISON:  None. FINDINGS: Bones: There is no evidence of acute fracture, dislocation or avascular necrosis. The femoral head is normal in shape, configuration and sphericity. No osseous bump at the femoral head neck junction. The visualized bony pelvis appears normal. The visualized sacroiliac joints and symphysis pubis appear normal. Articular cartilage and labrum Articular cartilage: There is mild superior joint space loss with chondral thinning. Labrum: There is no gross labral tear or paralabral abnormality. Joint or bursal effusion Joint effusion: There  is a small hip joint effusion. Bursae: Extending superiorly from the superior joint space within the iliopsoas bursa there is a complex multi-septated peripherally enhancing collection. The collection measures approximately 3.6 x 2.3 x 4.7 cm. There is scattered debris seen within the collection. The collection extends to the left lesser trochanter. Muscles and tendons Muscles and tendons: The visualized gluteus, hamstring and iliopsoas tendons appear normal. The piriformis muscles appear symmetric. Other findings Miscellaneous: The visualized internal pelvic contents appear unremarkable. IMPRESSION: Complex iliopsoas bursitis measuring  approximately 3.6 x 2.3 x 4.7 cm. Electronically Signed   By: Bindu  Avutu M.D.   On: 10Jonna Clark/05/2018 20:56   Ct Abdomen Pelvis W Contrast  Result Date: 12/10/2018 CLINICAL DATA:  Acute abdominal pain in the left abdomen/flank. EXAM: CT ABDOMEN AND PELVIS WITH CONTRAST TECHNIQUE: Multidetector CT imaging of the abdomen and pelvis was performed using the standard protocol following bolus administration of intravenous contrast. CONTRAST:  100mL OMNIPAQUE IOHEXOL 300 MG/ML  SOLN COMPARISON:  None. FINDINGS: Lower chest: No significant pulmonary nodules or acute consolidative airspace disease. Hepatobiliary: Normal liver size. No liver mass. Normal gallbladder with no radiopaque cholelithiasis. No biliary ductal dilatation. Tiny periampullary duodenal diverticulum. Pancreas: Normal, with no mass or duct dilation. Spleen: Normal size. No mass. Adrenals/Urinary Tract: Normal adrenals. Subcentimeter hypodense renal cortical lesion in the upper left kidney is too small to characterize and requires no follow-up. Otherwise normal kidneys, with no hydronephrosis. Normal bladder. Stomach/Bowel: Normal non-distended stomach. Normal caliber small bowel with no small bowel wall thickening. Normal appendix. Scattered mild colonic diverticulosis, with no large bowel wall thickening or significant pericolonic fat stranding. Vascular/Lymphatic: Atherosclerotic nonaneurysmal abdominal aorta. Patent portal, splenic, hepatic and renal veins. No pathologically enlarged lymph nodes in the abdomen or pelvis. Reproductive: Mildly enlarged myomatous uterus with dominant 2.9 cm anterior left uterine fibroid. No adnexal mass. Other: No pneumoperitoneum, ascites or intra-abdominal fluid collection. Musculoskeletal: No aggressive appearing focal osseous lesions. There is a low-attenuation 3.5 x 2.1 x 4.3 cm collection along the lateral margin of left iliopsoas muscle anterior to the left femoral neck (series 2/image 69). Mild lumbar spondylosis.  IMPRESSION: 1. Low-attenuation 3.5 x 2.1 x 4.3 cm collection along the lateral margin of the left iliopsoas muscle anterior to the left femoral neck, potentially a bursal collection. Infected collection cannot be excluded. MRI of the left hip without and with IV contrast may be obtained for further evaluation as clinically warranted. 2. No evidence of bowel obstruction or acute bowel inflammation. Mild colonic diverticulosis, with no evidence of acute diverticulitis. No hydronephrosis. 3. Mildly enlarged myomatous uterus. 4.  Aortic Atherosclerosis (ICD10-I70.0). Electronically Signed   By: Delbert PhenixJason A Poff M.D.   On: 12/10/2018 16:20    Scheduled Meds:  insulin aspart  0-9 Units Subcutaneous TID WC   lidocaine (PF)       QUEtiapine  100 mg Oral QHS    Continuous Infusions:   LOS: 0 days     Briant CedarNkeiruka J Ankith Edmonston, MD [facility]  If 7PM-7AM, please contact night-coverage [email or web address] 12/11/2018, 12:23 PM

## 2018-12-11 NOTE — Procedures (Signed)
Interventional Radiology Procedure Note  Procedure: US guided attempt at aspiration of left hip fluid collection -- most likely organized fluid secondary to bursitis. No spontaneous fluid aspirated, given the microcystic appearance.  No macro-cysts.  Sterile saline wash was applied and aspirated.   Findings: Micro-cystic fluid collection of the left hip.  Small wash was accomplished. No local erythema or fluctuance.    Complications: None  Recommendations:  - Follow up culture.  - Do not submerge for 7 days - Routine care   Signed,  Dulcy Fanny. Earleen Newport, DO

## 2018-12-11 NOTE — Progress Notes (Signed)
Inpatient Diabetes Program Recommendations  AACE/ADA: New Consensus Statement on Inpatient Glycemic Control (2015)  Target Ranges:  Prepandial:   less than 140 mg/dL      Peak postprandial:   less than 180 mg/dL (1-2 hours)      Critically ill patients:  140 - 180 mg/dL   Lab Results  Component Value Date   GLUCAP 116 (H) 12/11/2018   HGBA1C 7.8 (H) 12/11/2018    Spoke with patient over the phone regarding New Diabetes Diagnosis. Pt reports her mother has diabetes. We discussed lifestyle modifications in detail regarding Carbohydrates, serving sizes, and portion sizes. Also discussed drinking zero, diet or light, versions of beverages.   Spoke to patient about exercise 30 minutes 5 days a week.   Patient would like a chance to change her diet and exercise and follow up with a doctor.   Patient reports not having a PCP. Told patient to call the number on the back of her insurance card to get a list of physicians who accept her insurance in the area.   Told pt to follow up for post hospital in addition to DM follow up and to get an A1c rechecked making sure it stays down.   Discussed glucose and A1c goals. Pt wants to purchase a glucose meter over the counter instead of receive a prescription for it at time of d/c.  Attached educational materials to paperwork. Verified pt can read in [place].  Thanks, Tama Headings RN, MSN, BC-ADM Inpatient Diabetes Coordinator Team Pager [phone] (8a-5p)

## 2018-12-11 NOTE — Consult Note (Signed)
ORTHOPAEDIC CONSULTATION  REQUESTING PHYSICIAN: Alma Friendly, MD  Chief Complaint: Abdominal pain  HPI: Deborah Lee is a 63 y.o. female who presented originally for medical care due to abdominal pain.  She had CT abdomen pelvis with contrast and then MRI left hip with and without contrast which showed complex iliopsoas bursitis with a 3.6 x 2.3 x 4.7 cm fluid collection.  Orthopedics was consulted for evaluation.  Dr. Percell Miller discussed the case with the medicine team and recommended IR aspiration.  Today the patient denies any abdominal pain.  She denies pain in her left side or hip.  She denies any fever or systemic symptoms.  She denies injury.  She has been eating and voiding normally.  She would like to go home.   History reviewed. No pertinent past medical history. Past Surgical History:  Procedure Laterality Date   CESAREAN SECTION     SHOULDER SURGERY     Social History   Socioeconomic History   Marital status: Married    Spouse name: Not on file   Number of children: Not on file   Years of education: Not on file   Highest education level: Not on file  Occupational History   Not on file  Social Needs   Financial resource strain: Not on file   Food insecurity    Worry: Not on file    Inability: Not on file   Transportation needs    Medical: Not on file    Non-medical: Not on file  Tobacco Use   Smoking status: Never Smoker   Smokeless tobacco: Never Used  Substance and Sexual Activity   Alcohol use: Not Currently   Drug use: Not Currently   Sexual activity: Not on file  Lifestyle   Physical activity    Days per week: Not on file    Minutes per session: Not on file   Stress: Not on file  Relationships   Social connections    Talks on phone: Not on file    Gets together: Not on file    Attends religious service: Not on file    Active member of club or organization: Not on file    Attends meetings of clubs or organizations:  Not on file    Relationship status: Not on file  Other Topics Concern   Not on file  Social History Narrative   Not on file   Family History  Problem Relation Age of Onset   Diabetes Mother    No Known Allergies Prior to Admission medications   Medication Sig Start Date End Date Taking? Authorizing Provider  QUEtiapine (SEROQUEL) 100 MG tablet Take 100 mg by mouth at bedtime.  11/24/18  Yes [provider]   Mr Hip Left W Wo Contrast  Result Date: 12/10/2018 CLINICAL DATA:  Hip swelling, infection suspected EXAM: MRI OF THE LEFT HIP WITHOUT AND WITH CONTRAST TECHNIQUE: Multiplanar, multisequence MR imaging was performed both before and after administration of intravenous contrast. CONTRAST:  59m GADAVIST GADOBUTROL 1 MMOL/ML IV SOLN COMPARISON:  None. FINDINGS: Bones: There is no evidence of acute fracture, dislocation or avascular necrosis. The femoral head is normal in shape, configuration and sphericity. No osseous bump at the femoral head neck junction. The visualized bony pelvis appears normal. The visualized sacroiliac joints and symphysis pubis appear normal. Articular cartilage and labrum Articular cartilage: There is mild superior joint space loss with chondral thinning. Labrum: There is no gross labral tear or paralabral abnormality. Joint or  bursal effusion Joint effusion: There is a small hip joint effusion. Bursae: Extending superiorly from the superior joint space within the iliopsoas bursa there is a complex multi-septated peripherally enhancing collection. The collection measures approximately 3.6 x 2.3 x 4.7 cm. There is scattered debris seen within the collection. The collection extends to the left lesser trochanter. Muscles and tendons Muscles and tendons: The visualized gluteus, hamstring and iliopsoas tendons appear normal. The piriformis muscles appear symmetric. Other findings Miscellaneous: The visualized internal pelvic contents appear unremarkable. IMPRESSION:  Complex iliopsoas bursitis measuring approximately 3.6 x 2.3 x 4.7 cm. Electronically Signed   By: Prudencio Pair M.D.   On: 12/10/2018 20:56   Ct Abdomen Pelvis W Contrast  Result Date: 12/10/2018 CLINICAL DATA:  Acute abdominal pain in the left abdomen/flank. EXAM: CT ABDOMEN AND PELVIS WITH CONTRAST TECHNIQUE: Multidetector CT imaging of the abdomen and pelvis was performed using the standard protocol following bolus administration of intravenous contrast. CONTRAST:  126m OMNIPAQUE IOHEXOL 300 MG/ML  SOLN COMPARISON:  None. FINDINGS: Lower chest: No significant pulmonary nodules or acute consolidative airspace disease. Hepatobiliary: Normal liver size. No liver mass. Normal gallbladder with no radiopaque cholelithiasis. No biliary ductal dilatation. Tiny periampullary duodenal diverticulum. Pancreas: Normal, with no mass or duct dilation. Spleen: Normal size. No mass. Adrenals/Urinary Tract: Normal adrenals. Subcentimeter hypodense renal cortical lesion in the upper left kidney is too small to characterize and requires no follow-up. Otherwise normal kidneys, with no hydronephrosis. Normal bladder. Stomach/Bowel: Normal non-distended stomach. Normal caliber small bowel with no small bowel wall thickening. Normal appendix. Scattered mild colonic diverticulosis, with no large bowel wall thickening or significant pericolonic fat stranding. Vascular/Lymphatic: Atherosclerotic nonaneurysmal abdominal aorta. Patent portal, splenic, hepatic and renal veins. No pathologically enlarged lymph nodes in the abdomen or pelvis. Reproductive: Mildly enlarged myomatous uterus with dominant 2.9 cm anterior left uterine fibroid. No adnexal mass. Other: No pneumoperitoneum, ascites or intra-abdominal fluid collection. Musculoskeletal: No aggressive appearing focal osseous lesions. There is a low-attenuation 3.5 x 2.1 x 4.3 cm collection along the lateral margin of left iliopsoas muscle anterior to the left femoral neck (series  2/image 69). Mild lumbar spondylosis. IMPRESSION: 1. Low-attenuation 3.5 x 2.1 x 4.3 cm collection along the lateral margin of the left iliopsoas muscle anterior to the left femoral neck, potentially a bursal collection. Infected collection cannot be excluded. MRI of the left hip without and with IV contrast may be obtained for further evaluation as clinically warranted. 2. No evidence of bowel obstruction or acute bowel inflammation. Mild colonic diverticulosis, with no evidence of acute diverticulitis. No hydronephrosis. 3. Mildly enlarged myomatous uterus. 4.  Aortic Atherosclerosis (ICD10-I70.0). Electronically Signed   By: JIlona SorrelM.D.   On: 12/10/2018 16:20    Positive ROS: All other systems have been reviewed and were otherwise negative with the exception of those mentioned in the HPI and as above.  Objective: Labs cbc Recent Labs    12/10/18 0802 12/11/18 0141  WBC 7.9 5.4  HGB 14.6 13.8  HCT 43.6 39.7  PLT 217 202    Labs inflam No results for input(s): CRP in the last 72 hours.  Invalid input(s): ESR  Labs coag Recent Labs    12/11/18 0141  INR 1.2    Recent Labs    12/10/18 0802 12/11/18 0141  NA 139 138  K 4.1 3.5  CL 104 102  CO2 25 26  GLUCOSE 185* 298*  BUN 15 11  CREATININE 0.63 0.72  CALCIUM 9.2 8.9  Physical Exam: °Vitals:  ° 12/10/18 2214  °BP: 135/67  °Pulse: 69  °Resp: 14  °Temp: 98.1 °F (36.7 °C)  °SpO2: 100%  ° °General: Alert, no acute distress.  Upright in bed.  Calm, conversant. °Mental status: Alert and Oriented x3 °Neurologic: Speech Clear and organized, no gross focal findings or movement disorder appreciated. °Respiratory: No cyanosis, no use of accessory musculature °Cardiovascular: No pedal edema °GI: Abdomen is soft and non-tender, non-distended. °Skin: Warm and dry.  No lesions in the area of chief complaint. °Extremities: Warm and well perfused w/o edema °Psychiatric: Patient is competent for consent with normal mood and  affect ° °MUSCULOSKELETAL:  °LLE: °No pain with active or passive range of motion of the left hip.  No pain with resisted hip flexion °Other extremities are atraumatic with painless ROM and NVI. ° °Assessment / Plan: °Principal Problem: °  Iliopsoas bursitis °Active Problems: °  Hip pain °  Left lower quadrant pain ° ° °Iliopsoas bursitis with 3.6 x 2.3 x 4.7 cm collection °Asymptomatic.  Currently, there is no surgical indication.   ° °Recommend IR aspiration and antibiotic therapy. ° °Please call for questions or change in her medical condition. ° ° ° Calvin Martensen III PA-C °12/11/2018 °9:32 AM ° °

## 2018-12-11 NOTE — ED Notes (Signed)
ED TO INPATIENT HANDOFF REPORT  ED Nurse Name and Phone #: Lovell Sheehan [phone]  S Name/Age/Gender Deborah Lee 63 y.o. female Room/Bed: 012C/012C  Code Status   Code Status: Full Code  Home/SNF/Other Home Patient oriented to: self, place and time, person, and situation  Is this baseline? Yes   Triage Complete: Triage complete  Chief Complaint needs MRI   Triage Note No notes on file   Allergies No Known Allergies  Level of Care/Admitting Diagnosis ED Disposition    ED Disposition Condition [facility] Area: [place] [[id number]]  Level of Care: Telemetry Medical [104]  I expect the patient will be discharged within 24 hours: No (not a candidate for 5C-Observation unit)  Covid Evaluation: Asymptomatic Screening Protocol (No Symptoms)  Diagnosis: Hip pain [387564]  Admitting Physician: Jani Gravel [[id number]]  Attending Physician: Jani Gravel [[id number]]  PT Class (Do Not Modify): Observation [104]  PT Acc Code (Do Not Modify): Observation [10022]       B Medical/Surgery History History reviewed. No pertinent past medical history. Past Surgical History:  Procedure Laterality Date  . CESAREAN SECTION    . SHOULDER SURGERY       A IV Location/Drains/Wounds Patient Lines/Drains/Airways Status   Active Line/Drains/Airways    Name:   Placement date:   Placement time:   Site:   Days:   Peripheral IV 12/10/18 Left Antecubital   12/10/18    1431    Antecubital   1          Intake/Output Last 24 hours No intake or output data in the 24 hours ending 12/11/18 0030  Labs/Imaging Results for orders placed or performed during the hospital encounter of 12/10/18 (from the past 48 hour(s))  Urinalysis, Routine w reflex microscopic     Status: Abnormal   Collection Time: 12/10/18  7:47 AM  Result Value Ref Range   Color, Urine STRAW (A) YELLOW   APPearance CLEAR CLEAR   Specific Gravity, Urine 1.005 1.005 - 1.030   pH 6.0 5.0 - 8.0   Glucose, UA  NEGATIVE NEGATIVE mg/dL   Hgb urine dipstick NEGATIVE NEGATIVE   Bilirubin Urine NEGATIVE NEGATIVE   Ketones, ur NEGATIVE NEGATIVE mg/dL   Protein, ur NEGATIVE NEGATIVE mg/dL   Nitrite NEGATIVE NEGATIVE   Leukocytes,Ua NEGATIVE NEGATIVE    Comment: Performed at [facility], [place] [address], [place], [place] [address]  Lipase, blood     Status: None   Collection Time: 12/10/18  8:02 AM  Result Value Ref Range   Lipase 34 11 - 51 U/L    Comment: Performed at [facility], [place] [address], [place], [place] [address]  Comprehensive metabolic panel     Status: Abnormal   Collection Time: 12/10/18  8:02 AM  Result Value Ref Range   Sodium 139 135 - 145 mmol/L   Potassium 4.1 3.5 - 5.1 mmol/L   Chloride 104 98 - 111 mmol/L   CO2 25 22 - 32 mmol/L   Glucose, Bld 185 (H) 70 - 99 mg/dL   BUN 15 6 - 20 mg/dL   Creatinine, Ser 0.63 0.44 - 1.00 mg/dL   Calcium 9.2 8.9 - 10.3 mg/dL   Total Protein 7.3 6.5 - 8.1 g/dL   Albumin 4.4 3.5 - 5.0 g/dL   AST 22 15 - 41 U/L   ALT 18 0 - 44 U/L   Alkaline Phosphatase 94 38 - 126 U/L   Total Bilirubin 0.7 0.3 -  1.2 mg/dL   GFR calc non Af Amer >60 >60 mL/min   GFR calc Af Amer >60 >60 mL/min   Anion gap 10 5 - 15    Comment: Performed at [facility], [address] [place], [place] [address]  CBC     Status: None   Collection Time: 12/10/18  8:02 AM  Result Value Ref Range   WBC 7.9 4.0 - 10.5 K/uL   RBC 4.46 3.87 - 5.11 MIL/uL   Hemoglobin 14.6 12.0 - 15.0 g/dL   HCT 60.443.6 54.036.0 - 98.146.0 %   MCV 97.8 80.0 - 100.0 fL   MCH 32.7 26.0 - 34.0 pg   MCHC 33.5 30.0 - 36.0 g/dL   RDW 19.112.3 47.811.5 - 29.515.5 %   Platelets 217 150 - 400 K/uL   nRBC 0.0 0.0 - 0.2 %    Comment: Performed at [facility], [address] [place], [place] [address]  I-Stat beta hCG blood, ED     Status: None   Collection Time: 12/10/18  8:16 AM  Result Value Ref Range   I-stat hCG, quantitative <5.0 <5  mIU/mL   Comment 3            Comment:   GEST. AGE      CONC.  (mIU/mL)   <=1 WEEK        5 - 50     2 WEEKS       50 - 500     3 WEEKS       100 - 10,000     4 WEEKS     1,000 - 30,000        FEMALE AND NON-PREGNANT FEMALE:     LESS THAN 5 mIU/mL    Mr Hip Left W Wo Contrast  Result Date: 12/10/2018 CLINICAL DATA:  Hip swelling, infection suspected EXAM: MRI OF THE LEFT HIP WITHOUT AND WITH CONTRAST TECHNIQUE: Multiplanar, multisequence MR imaging was performed both before and after administration of intravenous contrast. CONTRAST:  5mL GADAVIST GADOBUTROL 1 MMOL/ML IV SOLN COMPARISON:  None. FINDINGS: Bones: There is no evidence of acute fracture, dislocation or avascular necrosis. The femoral head is normal in shape, configuration and sphericity. No osseous bump at the femoral head neck junction. The visualized bony pelvis appears normal. The visualized sacroiliac joints and symphysis pubis appear normal. Articular cartilage and labrum Articular cartilage: There is mild superior joint space loss with chondral thinning. Labrum: There is no gross labral tear or paralabral abnormality. Joint or bursal effusion Joint effusion: There is a small hip joint effusion. Bursae: Extending superiorly from the superior joint space within the iliopsoas bursa there is a complex multi-septated peripherally enhancing collection. The collection measures approximately 3.6 x 2.3 x 4.7 cm. There is scattered debris seen within the collection. The collection extends to the left lesser trochanter. Muscles and tendons Muscles and tendons: The visualized gluteus, hamstring and iliopsoas tendons appear normal. The piriformis muscles appear symmetric. Other findings Miscellaneous: The visualized internal pelvic contents appear unremarkable. IMPRESSION: Complex iliopsoas bursitis measuring approximately 3.6 x 2.3 x 4.7 cm. Electronically Signed   By: Jonna ClarkBindu  Avutu M.D.   On: 12/10/2018 20:56   Ct Abdomen Pelvis W  Contrast  Result Date: 12/10/2018 CLINICAL DATA:  Acute abdominal pain in the left abdomen/flank. EXAM: CT ABDOMEN AND PELVIS WITH CONTRAST TECHNIQUE: Multidetector CT imaging of the abdomen and pelvis was performed using the standard protocol following bolus administration of intravenous contrast. CONTRAST:  100mL  OMNIPAQUE IOHEXOL 300 MG/ML  SOLN COMPARISON:  None. FINDINGS: Lower chest: No significant pulmonary nodules or acute consolidative airspace disease. Hepatobiliary: Normal liver size. No liver mass. Normal gallbladder with no radiopaque cholelithiasis. No biliary ductal dilatation. Tiny periampullary duodenal diverticulum. Pancreas: Normal, with no mass or duct dilation. Spleen: Normal size. No mass. Adrenals/Urinary Tract: Normal adrenals. Subcentimeter hypodense renal cortical lesion in the upper left kidney is too small to characterize and requires no follow-up. Otherwise normal kidneys, with no hydronephrosis. Normal bladder. Stomach/Bowel: Normal non-distended stomach. Normal caliber small bowel with no small bowel wall thickening. Normal appendix. Scattered mild colonic diverticulosis, with no large bowel wall thickening or significant pericolonic fat stranding. Vascular/Lymphatic: Atherosclerotic nonaneurysmal abdominal aorta. Patent portal, splenic, hepatic and renal veins. No pathologically enlarged lymph nodes in the abdomen or pelvis. Reproductive: Mildly enlarged myomatous uterus with dominant 2.9 cm anterior left uterine fibroid. No adnexal mass. Other: No pneumoperitoneum, ascites or intra-abdominal fluid collection. Musculoskeletal: No aggressive appearing focal osseous lesions. There is a low-attenuation 3.5 x 2.1 x 4.3 cm collection along the lateral margin of left iliopsoas muscle anterior to the left femoral neck (series 2/image 69). Mild lumbar spondylosis. IMPRESSION: 1. Low-attenuation 3.5 x 2.1 x 4.3 cm collection along the lateral margin of the left iliopsoas muscle anterior to  the left femoral neck, potentially a bursal collection. Infected collection cannot be excluded. MRI of the left hip without and with IV contrast may be obtained for further evaluation as clinically warranted. 2. No evidence of bowel obstruction or acute bowel inflammation. Mild colonic diverticulosis, with no evidence of acute diverticulitis. No hydronephrosis. 3. Mildly enlarged myomatous uterus. 4.  Aortic Atherosclerosis (ICD10-I70.0). Electronically Signed   By: Delbert Phenix M.D.   On: 12/10/2018 16:20    Pending Labs Unresulted Labs (From admission, onward)    Start     Ordered   12/11/18 0500  HIV Antibody (routine testing w rflx)  (HIV Antibody (Routine testing w reflex) panel)  Tomorrow morning,   R     12/10/18 2338   12/11/18 0500  HIV4GL Save Tube  (HIV Antibody (Routine testing w reflex) panel)  Tomorrow morning,   R     12/10/18 2338   12/11/18 0500  Comprehensive metabolic panel  Tomorrow morning,   R     12/10/18 2338   12/11/18 0500  CBC  Tomorrow morning,   R     12/10/18 2338   12/11/18 0500  Protime-INR  Tomorrow morning,   R     12/10/18 2338   12/10/18 2340  Hemoglobin A1c  Add-on,   AD     12/10/18 2339   12/10/18 2243  SARS CORONAVIRUS 2 (TAT 6-24 HRS) Nasopharyngeal Nasopharyngeal Swab  (Asymptomatic/Tier 2 Patients Labs)  Once,   STAT    Question Answer Comment  Is this test for diagnosis or screening Screening   Symptomatic for COVID-19 as defined by CDC No   Hospitalized for COVID-19 No   Admitted to ICU for COVID-19 No   Previously tested for COVID-19 No   Resident in a congregate (group) care setting No   Employed in healthcare setting No   Pregnant No      12/10/18 2244          Vitals/Pain Today's Vitals   12/10/18 2214  BP: 135/67  Pulse: 69  Resp: 14  Temp: 98.1 F (36.7 C)  TempSrc: Oral  SpO2: 100%    Isolation Precautions No active isolations  Medications Medications  0.9 %  sodium chloride infusion (has no administration in time  range)  acetaminophen (TYLENOL) tablet 650 mg (has no administration in time range)    Or  acetaminophen (TYLENOL) suppository 650 mg (has no administration in time range)  gadobutrol (GADAVIST) 1 MMOL/ML injection 5 mL (5 mLs Intravenous Contrast Given 12/10/18 2021)    Mobility walks Low fall risk   Focused Assessments    R Recommendations: See Admitting Provider Note  Report given to:   Additional Notes:

## 2018-12-12 DIAGNOSIS — E119 Type 2 diabetes mellitus without complications: Secondary | ICD-10-CM | POA: Diagnosis not present

## 2018-12-12 DIAGNOSIS — R1032 Left lower quadrant pain: Secondary | ICD-10-CM | POA: Diagnosis not present

## 2018-12-12 DIAGNOSIS — M7072 Other bursitis of hip, left hip: Secondary | ICD-10-CM | POA: Diagnosis not present

## 2018-12-12 DIAGNOSIS — M25559 Pain in unspecified hip: Secondary | ICD-10-CM

## 2018-12-12 LAB — CBC WITH DIFFERENTIAL/PLATELET
Abs Immature Granulocytes: 0.01 10*3/uL (ref 0.00–0.07)
Basophils Absolute: 0 10*3/uL (ref 0.0–0.1)
Basophils Relative: 1 %
Eosinophils Absolute: 0.1 10*3/uL (ref 0.0–0.5)
Eosinophils Relative: 1 %
HCT: 38.5 % (ref 36.0–46.0)
Hemoglobin: 13.2 g/dL (ref 12.0–15.0)
Immature Granulocytes: 0 %
Lymphocytes Relative: 44 %
Lymphs Abs: 2.1 10*3/uL (ref 0.7–4.0)
MCH: 32.4 pg (ref 26.0–34.0)
MCHC: 34.3 g/dL (ref 30.0–36.0)
MCV: 94.6 fL (ref 80.0–100.0)
Monocytes Absolute: 0.4 10*3/uL (ref 0.1–1.0)
Monocytes Relative: 7 %
Neutro Abs: 2.2 10*3/uL (ref 1.7–7.7)
Neutrophils Relative %: 47 %
Platelets: 194 10*3/uL (ref 150–400)
RBC: 4.07 MIL/uL (ref 3.87–5.11)
RDW: 12.3 % (ref 11.5–15.5)
WBC: 4.8 10*3/uL (ref 4.0–10.5)
nRBC: 0 % (ref 0.0–0.2)

## 2018-12-12 LAB — GLUCOSE, CAPILLARY
Glucose-Capillary: 159 mg/dL — ABNORMAL HIGH (ref 70–99)
Glucose-Capillary: 182 mg/dL — ABNORMAL HIGH (ref 70–99)
Glucose-Capillary: 151 mg/dL — ABNORMAL HIGH (ref 70–99)

## 2018-12-12 LAB — BASIC METABOLIC PANEL
Anion gap: 7 (ref 5–15)
BUN: 9 mg/dL (ref 6–20)
CO2: 25 mmol/L (ref 22–32)
Calcium: 9 mg/dL (ref 8.9–10.3)
Chloride: 107 mmol/L (ref 98–111)
Creatinine, Ser: 0.62 mg/dL (ref 0.44–1.00)
GFR calc Af Amer: >60 mL/min (ref >60)
GFR calc non Af Amer: >60 mL/min (ref >60)
Glucose, Bld: 156 mg/dL — ABNORMAL HIGH (ref 70–99)
Potassium: 3.9 mmol/L (ref 3.5–5.1)
Sodium: 139 mmol/L (ref 135–145)

## 2018-12-12 MED ORDER — LIVING WELL WITH DIABETES BOOK - IN SPANISH: 1.0000 | Freq: Once | 0 refills | Status: AC

## 2018-12-12 NOTE — Progress Notes (Signed)
PROGRESS NOTE    Deborah Lee  ZOX:[id number] DOB: [birth year] DOA: 12/10/2018 PCP: Deborah Lee, No Pcp Per   Brief Narrative:  [name] a63 y.o.female,w left lower quadrant pain for 1 day duration.  Denied any other associated symptoms.  CT of the abdomen/pelvis showed iliopsoas bursitis, Deborah Lee was transferred from [facility] hospital to [facility] for MRI which showed similar findings.  Deborah Lee denies any pain in her left hip, no fever/chills, denies injuring it.  No other symptoms.  Deborah Lee admitted for further management.   Assessment & Plan:   Principal Problem:   Iliopsoas bursitis Active Problems:   Hip pain   Left lower quadrant pain   Complex iliopsoas bursitis on the left Asymptomatic Remain afebrile, with no leukocytosis MRI L hip showed complex iliopsoas bursitis measuring approximately 3.6 x 2.3 x 4.7 cm Orthopedics on board, no surgical indication, recommended IR aspiration and antibiotic therapy IR attempted aspiration of left hip fluid collection, Gram stain negative although culture report pending Due to no significant signs of infection, will continue to hold off antibiotics pending culture report  Likely newly diagnosed diabetes mellitus type 2 A1c noted to be 7.8 Not on any medication at home SSI, hypoglycemic protocol, [employer] DM coordinator consulted-pending   DVT prophylaxis: SCD/Compression stockings  Code Status: full    Code Status Orders  (From admission, onward)         Start     Ordered   12/10/18 2337  Full code  Continuous     12/10/18 2338        Code Status History    This Deborah Lee has a current code status but no historical code status.   Advance Care Planning Activity     Family Communication: Discussed in detail with Deborah Lee Disposition Plan:   Remain in the hospital currently pending culture results if negative likely discharge later today Consults called: None Admission status: Observation   Consultants:    Orthopedics, interventional radiology  Procedures:  Mr Hip Left W Wo Contrast  Result Date: 12/10/2018 CLINICAL DATA:  Hip swelling, infection suspected EXAM: MRI OF THE LEFT HIP WITHOUT AND WITH CONTRAST TECHNIQUE: Multiplanar, multisequence MR imaging was performed both before and after administration of intravenous contrast. CONTRAST:  5mL GADAVIST GADOBUTROL 1 MMOL/ML IV SOLN COMPARISON:  None. FINDINGS: Bones: There is no evidence of acute fracture, dislocation or avascular necrosis. The femoral head is normal in shape, configuration and sphericity. No osseous bump at the femoral head neck junction. The visualized bony pelvis appears normal. The visualized sacroiliac joints and symphysis pubis appear normal. Articular cartilage and labrum Articular cartilage: There is mild superior joint space loss with chondral thinning. Labrum: There is no gross labral tear or paralabral abnormality. Joint or bursal effusion Joint effusion: There is a small hip joint effusion. Bursae: Extending superiorly from the superior joint space within the iliopsoas bursa there is a complex multi-septated peripherally enhancing collection. The collection measures approximately 3.6 x 2.3 x 4.7 cm. There is scattered debris seen within the collection. The collection extends to the left lesser trochanter. Muscles and tendons Muscles and tendons: The visualized gluteus, hamstring and iliopsoas tendons appear normal. The piriformis muscles appear symmetric. Other findings Miscellaneous: The visualized internal pelvic contents appear unremarkable. IMPRESSION: Complex iliopsoas bursitis measuring approximately 3.6 x 2.3 x 4.7 cm. Electronically Signed   By: Jonna Clark M.D.   On: 12/10/2018 20:56   US Guided Needle Placement  Result Date: 12/11/2018 INDICATION: 63 year old female with a suspected incidental finding of left  iliopsoas bursitis, referred for aspiration EXAM: IR ULTRASOUND GUIDED ASPIRATION/DRAINAGE MEDICATIONS: None  ANESTHESIA/SEDATION: None COMPLICATIONS: None PROCEDURE: Informed written consent was obtained from the Deborah Lee after a thorough discussion of the procedural risks, benefits and alternatives. All questions were addressed. Maximal Sterile Barrier Technique was utilized including caps, mask, sterile gowns, sterile gloves, sterile drape, hand hygiene and skin antiseptic. A timeout was performed prior to the initiation of the procedure. Ultrasound images were performed with images stored sent to PACs. Time-out was performed. The Deborah Lee is prepped and draped in the usual sterile fashion. 1% lidocaine was used for local anesthesia. 18 gauge needle was advanced under ultrasound guidance into the fluid collection at the left hip. Aspiration was unsuccessful with spontaneous fluid returned. A sterile saline wash was used with approximately 2-3 cc of sterile saline, with the aspirate aspirated for a culture. Deborah Lee tolerated the procedure well and remained hemodynamically stable throughout. No complications were encountered and no significant blood loss. FINDINGS: Ultrasound demonstrates a spongiform/microcystic nodule in the region of the left hip, corresponding to findings on prior CT and MRI. No spontaneous aspirate could be achieved given the microcystic configuration. Sterile saline wash was performed. On physical exam there is absence of any air it the [facility] or swelling. No tenderness on palpation, and the Deborah Lee tells me she is asymptomatic. IMPRESSION: Status post ultrasound-guided aspirate of left hip fluid collection, likely complex/organized iliopsoas bursitis. Signed, Yvone NeuJaime S. Reyne DumasWagner, DO, RPVI [facility] Electronically Signed   By: Gilmer MorJaime  Wagner D.O.   On: 12/11/2018 13:02   Ct Abdomen Pelvis W Contrast  Result Date: 12/10/2018 CLINICAL DATA:  Acute abdominal pain in the left abdomen/flank. EXAM: CT ABDOMEN AND PELVIS WITH CONTRAST TECHNIQUE:  Multidetector CT imaging of the abdomen and pelvis was performed using the standard protocol following bolus administration of intravenous contrast. CONTRAST:  100mL OMNIPAQUE IOHEXOL 300 MG/ML  SOLN COMPARISON:  None. FINDINGS: Lower chest: No significant pulmonary nodules or acute consolidative airspace disease. Hepatobiliary: Normal liver size. No liver mass. Normal gallbladder with no radiopaque cholelithiasis. No biliary ductal dilatation. Tiny periampullary duodenal diverticulum. Pancreas: Normal, with no mass or duct dilation. Spleen: Normal size. No mass. Adrenals/Urinary Tract: Normal adrenals. Subcentimeter hypodense renal cortical lesion in the upper left kidney is too small to characterize and requires no follow-up. Otherwise normal kidneys, with no hydronephrosis. Normal bladder. Stomach/Bowel: Normal non-distended stomach. Normal caliber small bowel with no small bowel wall thickening. Normal appendix. Scattered mild colonic diverticulosis, with no large bowel wall thickening or significant pericolonic fat stranding. Vascular/Lymphatic: Atherosclerotic nonaneurysmal abdominal aorta. Patent portal, splenic, hepatic and renal veins. No pathologically enlarged lymph nodes in the abdomen or pelvis. Reproductive: Mildly enlarged myomatous uterus with dominant 2.9 cm anterior left uterine fibroid. No adnexal mass. Other: No pneumoperitoneum, ascites or intra-abdominal fluid collection. Musculoskeletal: No aggressive appearing focal osseous lesions. There is a low-attenuation 3.5 x 2.1 x 4.3 cm collection along the lateral margin of left iliopsoas muscle anterior to the left femoral neck (series 2/image 69). Mild lumbar spondylosis. IMPRESSION: 1. Low-attenuation 3.5 x 2.1 x 4.3 cm collection along the lateral margin of the left iliopsoas muscle anterior to the left femoral neck, potentially a bursal collection. Infected collection cannot be excluded. MRI of the left hip without and with IV contrast may be  obtained for further evaluation as clinically warranted. 2. No evidence of bowel obstruction or acute bowel inflammation. Mild colonic diverticulosis, with no evidence of acute diverticulitis. No hydronephrosis. 3. Mildly enlarged  myomatous uterus. 4.  Aortic Atherosclerosis (ICD10-I70.0). Electronically Signed   By: Delbert Phenix M.D.   On: 12/10/2018 16:20     Antimicrobials:   None   Subjective: No acute events overnight Wants to go home if able to pending culture results  Objective: Vitals:   12/11/18 0900 12/11/18 1105 12/11/18 1125 12/11/18 1530  BP: 125/79 130/81 121/73 130/77  Pulse: 80   70  Resp: 18     Temp: 98 F (36.7 C)   98 F (36.7 C)  TempSrc:    Oral  SpO2: 98%   100%    Intake/Output Summary (Last 24 hours) at 12/12/2018 0852 Last data filed at 12/12/2018 0600 Gross per 24 hour  Intake 980 ml  Output 0 ml  Net 980 ml   There were no vitals filed for this visit.  Examination:  General exam: Appears calm and comfortable  Respiratory system: Clear to auscultation. Respiratory effort normal. Cardiovascular system: S1 & S2 heard, RRR. No JVD, murmurs, rubs, gallops or clicks. No pedal edema. Gastrointestinal system: Abdomen is nondistended, soft and nontender. No organomegaly or masses felt. Normal bowel sounds heard. Central nervous system: Alert and oriented. No focal neurological deficits. Extremities: Warm well perfused, no edema, neurovascularly intact Skin: No rashes, lesions or ulcers Psychiatry: Judgement and insight appear normal. Mood & affect appropriate.     Data Reviewed: I have personally reviewed following labs and imaging studies  CBC: Recent Labs  Lab 12/10/18 0802 12/11/18 0141 12/12/18 0556  WBC 7.9 5.4 4.8  NEUTROABS  --   --  2.2  HGB 14.6 13.8 13.2  HCT 43.6 39.7 38.5  MCV 97.8 95.2 94.6  PLT 217 202 194   Basic Metabolic Panel: Recent Labs  Lab 12/10/18 0802 12/11/18 0141 12/12/18 0556  NA 139 138 139  K 4.1 3.5  3.9  CL 104 102 107  CO2 GLUCOSE 185* 298* 156*  BUN CREATININE 0.63 0.72 0.62  CALCIUM 9.2 8.9 9.0   GFR: CrCl cannot be calculated (Unknown ideal weight.). Liver Function Tests: Recent Labs  Lab 12/10/18 0802 12/11/18 0141  AST 22 23  ALT 18 17  ALKPHOS 94 75  BILITOT 0.7 0.5  PROT 7.3 6.2*  ALBUMIN 4.4 3.4*   Recent Labs  Lab 12/10/18 0802  LIPASE 34   No results for input(s): AMMONIA in the last 168 hours. Coagulation Profile: Recent Labs  Lab 12/11/18 0141  INR 1.2   Cardiac Enzymes: No results for input(s): CKTOTAL, CKMB, CKMBINDEX, TROPONINI in the last 168 hours. BNP (last 3 results) No results for input(s): PROBNP in the last 8760 hours. HbA1C: Recent Labs    12/11/18 0141  HGBA1C 7.8*   CBG: Recent Labs  Lab 12/11/18 0808 12/11/18 1632 12/11/18 2124 12/12/18 0702  GLUCAP 116* 175* 135* 159*   Lipid Profile: No results for input(s): CHOL, HDL, LDLCALC, TRIG, CHOLHDL, LDLDIRECT in the last 72 hours. Thyroid Function Tests: No results for input(s): TSH, T4TOTAL, FREET4, T3FREE, THYROIDAB in the last 72 hours. Anemia Panel: No results for input(s): VITAMINB12, FOLATE, FERRITIN, TIBC, IRON, RETICCTPCT in the last 72 hours. Sepsis Labs: No results for input(s): PROCALCITON, LATICACIDVEN in the last 168 hours.  Recent Results (from the past 240 hour(s))  SARS CORONAVIRUS 2 (TAT 6-24 HRS) Nasopharyngeal Nasopharyngeal Swab     Status: None   Collection Time: 12/10/18 10:56 PM   Specimen: Nasopharyngeal Swab  Result Value Ref Range Status   SARS  Coronavirus 2 NEGATIVE NEGATIVE Final    Comment: (NOTE) SARS-CoV-2 target nucleic acids are NOT DETECTED. The SARS-CoV-2 RNA is generally detectable in upper and lower respiratory specimens during the acute phase of infection. Negative results do not preclude SARS-CoV-2 infection, do not rule out co-infections with other pathogens, and should not be used as the sole basis for  treatment or other Deborah Lee management decisions. Negative results must be combined with clinical observations, Deborah Lee history, and epidemiological information. The expected result is Negative. Fact Sheet for Patients: HairSlick.no Fact Sheet for Healthcare Providers: quierodirigir.com This test is not yet approved or cleared by the [place] FDA and  has been authorized for detection and/or diagnosis of SARS-CoV-2 by FDA under an Emergency Use Authorization (EUA). This EUA will remain  in effect (meaning this test can be used) for the duration of the COVID-19 declaration under Section 56 4(b)(1) of the Act, 21 U.S.C. section 360bbb-3(b)(1), unless the authorization is terminated or revoked sooner. Performed at [facility], [address] [place], [place] [address]   Aerobic/Anaerobic Culture (surgical/deep wound)     Status: None (Preliminary result)   Collection Time: 12/11/18 11:58 AM   Specimen: Wound; Synovial Fluid  Result Value Ref Range Status   Specimen Description WOUND LEFT ILIOPSOAS BURSA  Final   Special Requests NONE  Final   Gram Stain   Final    RARE WBC PRESENT, PREDOMINANTLY PMN NO ORGANISMS SEEN Performed at [facility], [address] [place], [place] [address]    Culture PENDING  Incomplete   Report Status PENDING  Incomplete         Radiology Studies: Mr Hip Left W Wo Contrast  Result Date: 12/10/2018 CLINICAL DATA:  Hip swelling, infection suspected EXAM: MRI OF THE LEFT HIP WITHOUT AND WITH CONTRAST TECHNIQUE: Multiplanar, multisequence MR imaging was performed both before and after administration of intravenous contrast. CONTRAST:  5mL GADAVIST GADOBUTROL 1 MMOL/ML IV SOLN COMPARISON:  None. FINDINGS: Bones: There is no evidence of acute fracture, dislocation or avascular necrosis. The femoral head is normal in shape, configuration and sphericity. No osseous bump at the femoral  head neck junction. The visualized bony pelvis appears normal. The visualized sacroiliac joints and symphysis pubis appear normal. Articular cartilage and labrum Articular cartilage: There is mild superior joint space loss with chondral thinning. Labrum: There is no gross labral tear or paralabral abnormality. Joint or bursal effusion Joint effusion: There is a small hip joint effusion. Bursae: Extending superiorly from the superior joint space within the iliopsoas bursa there is a complex multi-septated peripherally enhancing collection. The collection measures approximately 3.6 x 2.3 x 4.7 cm. There is scattered debris seen within the collection. The collection extends to the left lesser trochanter. Muscles and tendons Muscles and tendons: The visualized gluteus, hamstring and iliopsoas tendons appear normal. The piriformis muscles appear symmetric. Other findings Miscellaneous: The visualized internal pelvic contents appear unremarkable. IMPRESSION: Complex iliopsoas bursitis measuring approximately 3.6 x 2.3 x 4.7 cm. Electronically Signed   By: Jonna Clark M.D.   On: 12/10/2018 20:56   US Guided Needle Placement  Result Date: 12/11/2018 INDICATION: 63 year old female with a suspected incidental finding of left iliopsoas bursitis, referred for aspiration EXAM: IR ULTRASOUND GUIDED ASPIRATION/DRAINAGE MEDICATIONS: None ANESTHESIA/SEDATION: None COMPLICATIONS: None PROCEDURE: Informed written consent was obtained from the Deborah Lee after a thorough discussion of the procedural risks, benefits and alternatives. All questions were addressed. Maximal Sterile Barrier Technique was utilized including caps, mask, sterile gowns, sterile  gloves, sterile drape, hand hygiene and skin antiseptic. A timeout was performed prior to the initiation of the procedure. Ultrasound images were performed with images stored sent to PACs. Time-out was performed. The Deborah Lee is prepped and draped in the usual sterile fashion. 1%  lidocaine was used for local anesthesia. 18 gauge needle was advanced under ultrasound guidance into the fluid collection at the left hip. Aspiration was unsuccessful with spontaneous fluid returned. A sterile saline wash was used with approximately 2-3 cc of sterile saline, with the aspirate aspirated for a culture. Deborah Lee tolerated the procedure well and remained hemodynamically stable throughout. No complications were encountered and no significant blood loss. FINDINGS: Ultrasound demonstrates a spongiform/microcystic nodule in the region of the left hip, corresponding to findings on prior CT and MRI. No spontaneous aspirate could be achieved given the microcystic configuration. Sterile saline wash was performed. On physical exam there is absence of any air it the [facility] or swelling. No tenderness on palpation, and the Deborah Lee tells me she is asymptomatic. IMPRESSION: Status post ultrasound-guided aspirate of left hip fluid collection, likely complex/organized iliopsoas bursitis. Signed, Dulcy Fanny. Dellia Nims, RPVI [facility] Electronically Signed   By: Corrie Mckusick D.O.   On: 12/11/2018 13:02   Ct Abdomen Pelvis W Contrast  Result Date: 12/10/2018 CLINICAL DATA:  Acute abdominal pain in the left abdomen/flank. EXAM: CT ABDOMEN AND PELVIS WITH CONTRAST TECHNIQUE: Multidetector CT imaging of the abdomen and pelvis was performed using the standard protocol following bolus administration of intravenous contrast. CONTRAST:  135mL OMNIPAQUE IOHEXOL 300 MG/ML  SOLN COMPARISON:  None. FINDINGS: Lower chest: No significant pulmonary nodules or acute consolidative airspace disease. Hepatobiliary: Normal liver size. No liver mass. Normal gallbladder with no radiopaque cholelithiasis. No biliary ductal dilatation. Tiny periampullary duodenal diverticulum. Pancreas: Normal, with no mass or duct dilation. Spleen: Normal size. No mass. Adrenals/Urinary Tract:  Normal adrenals. Subcentimeter hypodense renal cortical lesion in the upper left kidney is too small to characterize and requires no follow-up. Otherwise normal kidneys, with no hydronephrosis. Normal bladder. Stomach/Bowel: Normal non-distended stomach. Normal caliber small bowel with no small bowel wall thickening. Normal appendix. Scattered mild colonic diverticulosis, with no large bowel wall thickening or significant pericolonic fat stranding. Vascular/Lymphatic: Atherosclerotic nonaneurysmal abdominal aorta. Patent portal, splenic, hepatic and renal veins. No pathologically enlarged lymph nodes in the abdomen or pelvis. Reproductive: Mildly enlarged myomatous uterus with dominant 2.9 cm anterior left uterine fibroid. No adnexal mass. Other: No pneumoperitoneum, ascites or intra-abdominal fluid collection. Musculoskeletal: No aggressive appearing focal osseous lesions. There is a low-attenuation 3.5 x 2.1 x 4.3 cm collection along the lateral margin of left iliopsoas muscle anterior to the left femoral neck (series 2/image 69). Mild lumbar spondylosis. IMPRESSION: 1. Low-attenuation 3.5 x 2.1 x 4.3 cm collection along the lateral margin of the left iliopsoas muscle anterior to the left femoral neck, potentially a bursal collection. Infected collection cannot be excluded. MRI of the left hip without and with IV contrast may be obtained for further evaluation as clinically warranted. 2. No evidence of bowel obstruction or acute bowel inflammation. Mild colonic diverticulosis, with no evidence of acute diverticulitis. No hydronephrosis. 3. Mildly enlarged myomatous uterus. 4.  Aortic Atherosclerosis (ICD10-I70.0). Electronically Signed   By: Ilona Sorrel M.D.   On: 12/10/2018 16:20        Scheduled Meds:  insulin aspart  0-9 Units Subcutaneous TID WC   living well with diabetes book- in spanish   Does not apply  Once   QUEtiapine  100 mg Oral QHS   Continuous Infusions:   LOS: 0 days    Time  spent: 35 min    Burke Keels, MD [facility]  If 7PM-7AM, please contact night-coverage  12/12/2018, 8:52 AM

## 2018-12-12 NOTE — Discharge Summary (Signed)
Physician Discharge Summary  Deborah Lee ZOX:[id number] DOB: Mar 08, 1956 DOA: 12/10/2018  PCP: Patient, No Pcp Per  Admit date: 12/10/2018 Discharge date: 12/12/2018  Admitted From: Observation Disposition: home  Recommendations for Outpatient Follow-up:  1. Follow up with PCP in 1-2 weeks   Home Health:No Equipment/Devices:none  Discharge Condition:Stable CODE STATUS:Full code Diet recommendation: Diabetic diet  Brief/Interim Summary:  Complex iliopsoas bursitison the left Asymptomatic Remained afebrile, with no leukocytosis MRIL hipshowed complex iliopsoas bursitis measuring approximately 3.6 x 2.3 x 4.7 cm Orthopedics saw the patient in consultation with recommendations for no surgical indication, recommended IR aspiration and antibiotic therapy IR aspirated left hip fluid collection, Gram stain negative, culture report no growth No indication for abx, pt to use OTC for prn pain tx  Likely newly diagnosed diabetes mellitus type 2 A1c noted to be 7.8 Not on any medication at home Reported mother has dm Was placed on SSI, hypoglycemic protocol, [employer] DM coordinator consulted-pt declined medication and glucometer, indicated she wanted to try diet and exercise.  Indicated she would buy an OTC glucometer and f/up with ehr PCP for further recommendations and tx  Discharge Diagnoses:  Principal Problem:   Iliopsoas bursitis Active Problems:   Hip pain   Left lower quadrant pain    Discharge Instructions  Discharge Instructions    Call MD for:   Complete by: As directed    Any change in medical condition   Call MD for:  difficulty breathing, headache or visual disturbances   Complete by: As directed    Call MD for:  extreme fatigue   Complete by: As directed    Call MD for:  hives   Complete by: As directed    Call MD for:  persistant dizziness or light-headedness   Complete by: As directed    Call MD for:  persistant nausea and vomiting   Complete by:  As directed    Call MD for:  redness, tenderness, or signs of infection (pain, swelling, redness, odor or green/yellow discharge around incision site)   Complete by: As directed    Call MD for:  severe uncontrolled pain   Complete by: As directed    Call MD for:  temperature >100.4   Complete by: As directed    Diet Carb Modified   Complete by: As directed    Increase activity slowly   Complete by: As directed      Allergies as of 12/12/2018   No Known Allergies     Medication List    TAKE these medications   living well with diabetes book- in spanish Misc 1 each by Does not apply route once for 1 dose.   QUEtiapine 100 MG tablet Commonly known as: SEROQUEL Take 100 mg by mouth at bedtime.       No Known Allergies  Consultations:  IR, Ortho   Procedures/Studies: Mr Hip Left W Wo Contrast  Result Date: 12/10/2018 CLINICAL DATA:  Hip swelling, infection suspected EXAM: MRI OF THE LEFT HIP WITHOUT AND WITH CONTRAST TECHNIQUE: Multiplanar, multisequence MR imaging was performed both before and after administration of intravenous contrast. CONTRAST:  5mL GADAVIST GADOBUTROL 1 MMOL/ML IV SOLN COMPARISON:  None. FINDINGS: Bones: There is no evidence of acute fracture, dislocation or avascular necrosis. The femoral head is normal in shape, configuration and sphericity. No osseous bump at the femoral head neck junction. The visualized bony pelvis appears normal. The visualized sacroiliac joints and symphysis pubis appear normal. Articular cartilage and labrum Articular cartilage: There is mild  superior joint space loss with chondral thinning. Labrum: There is no gross labral tear or paralabral abnormality. Joint or bursal effusion Joint effusion: There is a small hip joint effusion. Bursae: Extending superiorly from the superior joint space within the iliopsoas bursa there is a complex multi-septated peripherally enhancing collection. The collection measures approximately 3.6 x 2.3 x  4.7 cm. There is scattered debris seen within the collection. The collection extends to the left lesser trochanter. Muscles and tendons Muscles and tendons: The visualized gluteus, hamstring and iliopsoas tendons appear normal. The piriformis muscles appear symmetric. Other findings Miscellaneous: The visualized internal pelvic contents appear unremarkable. IMPRESSION: Complex iliopsoas bursitis measuring approximately 3.6 x 2.3 x 4.7 cm. Electronically Signed   By: Prudencio Pair M.D.   On: 12/10/2018 20:56   US Guided Needle Placement  Result Date: 12/11/2018 INDICATION: 63 year old female with a suspected incidental finding of left iliopsoas bursitis, referred for aspiration EXAM: IR ULTRASOUND GUIDED ASPIRATION/DRAINAGE MEDICATIONS: None ANESTHESIA/SEDATION: None COMPLICATIONS: None PROCEDURE: Informed written consent was obtained from the patient after a thorough discussion of the procedural risks, benefits and alternatives. All questions were addressed. Maximal Sterile Barrier Technique was utilized including caps, mask, sterile gowns, sterile gloves, sterile drape, hand hygiene and skin antiseptic. A timeout was performed prior to the initiation of the procedure. Ultrasound images were performed with images stored sent to PACs. Time-out was performed. The patient is prepped and draped in the usual sterile fashion. 1% lidocaine was used for local anesthesia. 18 gauge needle was advanced under ultrasound guidance into the fluid collection at the left hip. Aspiration was unsuccessful with spontaneous fluid returned. A sterile saline wash was used with approximately 2-3 cc of sterile saline, with the aspirate aspirated for a culture. Patient tolerated the procedure well and remained hemodynamically stable throughout. No complications were encountered and no significant blood loss. FINDINGS: Ultrasound demonstrates a spongiform/microcystic nodule in the region of the left hip, corresponding to findings on prior  CT and MRI. No spontaneous aspirate could be achieved given the microcystic configuration. Sterile saline wash was performed. On physical exam there is absence of any air it the [facility] or swelling. No tenderness on palpation, and the patient tells me she is asymptomatic. IMPRESSION: Status post ultrasound-guided aspirate of left hip fluid collection, likely complex/organized iliopsoas bursitis. Signed, Dulcy Fanny. Dellia Nims, RPVI [facility] Radiology Electronically Signed   By: Corrie Mckusick D.O.   On: 12/11/2018 13:02   Ct Abdomen Pelvis W Contrast  Result Date: 12/10/2018 CLINICAL DATA:  Acute abdominal pain in the left abdomen/flank. EXAM: CT ABDOMEN AND PELVIS WITH CONTRAST TECHNIQUE: Multidetector CT imaging of the abdomen and pelvis was performed using the standard protocol following bolus administration of intravenous contrast. CONTRAST:  121mL OMNIPAQUE IOHEXOL 300 MG/ML  SOLN COMPARISON:  None. FINDINGS: Lower chest: No significant pulmonary nodules or acute consolidative airspace disease. Hepatobiliary: Normal liver size. No liver mass. Normal gallbladder with no radiopaque cholelithiasis. No biliary ductal dilatation. Tiny periampullary duodenal diverticulum. Pancreas: Normal, with no mass or duct dilation. Spleen: Normal size. No mass. Adrenals/Urinary Tract: Normal adrenals. Subcentimeter hypodense renal cortical lesion in the upper left kidney is too small to characterize and requires no follow-up. Otherwise normal kidneys, with no hydronephrosis. Normal bladder. Stomach/Bowel: Normal non-distended stomach. Normal caliber small bowel with no small bowel wall thickening. Normal appendix. Scattered mild colonic diverticulosis, with no large bowel wall thickening or significant pericolonic fat stranding. Vascular/Lymphatic: Atherosclerotic nonaneurysmal abdominal aorta. Patent portal, splenic, hepatic and  renal veins. No pathologically enlarged lymph  nodes in the abdomen or pelvis. Reproductive: Mildly enlarged myomatous uterus with dominant 2.9 cm anterior left uterine fibroid. No adnexal mass. Other: No pneumoperitoneum, ascites or intra-abdominal fluid collection. Musculoskeletal: No aggressive appearing focal osseous lesions. There is a low-attenuation 3.5 x 2.1 x 4.3 cm collection along the lateral margin of left iliopsoas muscle anterior to the left femoral neck (series 2/image 69). Mild lumbar spondylosis. IMPRESSION: 1. Low-attenuation 3.5 x 2.1 x 4.3 cm collection along the lateral margin of the left iliopsoas muscle anterior to the left femoral neck, potentially a bursal collection. Infected collection cannot be excluded. MRI of the left hip without and with IV contrast may be obtained for further evaluation as clinically warranted. 2. No evidence of bowel obstruction or acute bowel inflammation. Mild colonic diverticulosis, with no evidence of acute diverticulitis. No hydronephrosis. 3. Mildly enlarged myomatous uterus. 4.  Aortic Atherosclerosis (ICD10-I70.0). Electronically Signed   By: Delbert Phenix M.D.   On: 12/10/2018 16:20       Subjective: Stable, pain free, wanted to be discharged home   Discharge Exam: Vitals:   12/11/18 1530 12/12/18 0912  BP: 130/77 105/67  Pulse: 70 75  Resp:  18  Temp: 98 F (36.7 C) 98.1 F (36.7 C)  SpO2: 100% 95%   Vitals:   12/11/18 1105 12/11/18 1125 12/11/18 1530 12/12/18 0912  BP: 130/81 121/73 130/77 105/67  Pulse:   70 75  Resp:    18  Temp:   98 F (36.7 C) 98.1 F (36.7 C)  TempSrc:   Oral Oral  SpO2:   100% 95%    General: Pt is alert, awake, not in acute distress Cardiovascular: RRR, S1/S2 +, no rubs, no gallops Respiratory: CTA bilaterally, no wheezing, no rhonchi Abdominal: Soft, NT, ND, bowel sounds + Extremities: no edema, no cyanosis    The results of significant diagnostics from this hospitalization (including imaging, microbiology, ancillary and laboratory) are  listed below for reference.     Microbiology: Recent Results (from the past 240 hour(s))  SARS CORONAVIRUS 2 (TAT 6-24 HRS) Nasopharyngeal Nasopharyngeal Swab     Status: None   Collection Time: 12/10/18 10:56 PM   Specimen: Nasopharyngeal Swab  Result Value Ref Range Status   SARS Coronavirus 2 NEGATIVE NEGATIVE Final    Comment: (NOTE) SARS-CoV-2 target nucleic acids are NOT DETECTED. The SARS-CoV-2 RNA is generally detectable in upper and lower respiratory specimens during the acute phase of infection. Negative results do not preclude SARS-CoV-2 infection, do not rule out co-infections with other pathogens, and should not be used as the sole basis for treatment or other patient management decisions. Negative results must be combined with clinical observations, patient history, and epidemiological information. The expected result is Negative. Fact Sheet for Patients: HairSlick.no Fact Sheet for Healthcare Providers: quierodirigir.com This test is not yet approved or cleared by the [place] FDA and  has been authorized for detection and/or diagnosis of SARS-CoV-2 by FDA under an Emergency Use Authorization (EUA). This EUA will remain  in effect (meaning this test can be used) for the duration of the COVID-19 declaration under Section 56 4(b)(1) of the Act, 21 U.S.C. section 360bbb-3(b)(1), unless the authorization is terminated or revoked sooner. Performed at [facility], [address] [place], [place] [address]   Aerobic/Anaerobic Culture (surgical/deep wound)     Status: None (Preliminary result)   Collection Time: 12/11/18 11:58 AM   Specimen: Wound; Synovial Fluid  Result Value  Ref Range Status   Specimen Description WOUND LEFT ILIOPSOAS BURSA  Final   Special Requests NONE  Final   Gram Stain   Final    RARE WBC PRESENT, PREDOMINANTLY PMN NO ORGANISMS SEEN    Culture   Final    NO GROWTH 1  DAY Performed at [facility], [address] [place], [place] [address]    Report Status PENDING  Incomplete     Labs: BNP (last 3 results) No results for input(s): BNP in the last 8760 hours. Basic Metabolic Panel: Recent Labs  Lab 12/10/18 0802 12/11/18 0141 12/12/18 0556  NA 139 138 139  K 4.1 3.5 3.9  CL 104 102 107  CO2 25 26 25   GLUCOSE 185* 298* 156*  BUN 15 11 9   CREATININE 0.63 0.72 0.62  CALCIUM 9.2 8.9 9.0   Liver Function Tests: Recent Labs  Lab 12/10/18 0802 12/11/18 0141  AST 22 23  ALT 18 17  ALKPHOS 94 75  BILITOT 0.7 0.5  PROT 7.3 6.2*  ALBUMIN 4.4 3.4*   Recent Labs  Lab 12/10/18 0802  LIPASE 34   No results for input(s): AMMONIA in the last 168 hours. CBC: Recent Labs  Lab 12/10/18 0802 12/11/18 0141 12/12/18 0556  WBC 7.9 5.4 4.8  NEUTROABS  --   --  2.2  HGB 14.6 13.8 13.2  HCT 43.6 39.7 38.5  MCV 97.8 95.2 94.6  PLT 217 202 194   Cardiac Enzymes: No results for input(s): CKTOTAL, CKMB, CKMBINDEX, TROPONINI in the last 168 hours. BNP: Invalid input(s): POCBNP CBG: Recent Labs  Lab 12/11/18 0808 12/11/18 1632 12/11/18 2124 12/12/18 0702 12/12/18 1139  GLUCAP 116* 175* 135* 159* 182*   D-Dimer No results for input(s): DDIMER in the last 72 hours. Hgb A1c Recent Labs    12/11/18 0141  HGBA1C 7.8*   Lipid Profile No results for input(s): CHOL, HDL, LDLCALC, TRIG, CHOLHDL, LDLDIRECT in the last 72 hours. Thyroid function studies No results for input(s): TSH, T4TOTAL, T3FREE, THYROIDAB in the last 72 hours.  Invalid input(s): FREET3 Anemia work up No results for input(s): VITAMINB12, FOLATE, FERRITIN, TIBC, IRON, RETICCTPCT in the last 72 hours. Urinalysis    Component Value Date/Time   COLORURINE STRAW (A) 12/10/2018 0747   APPEARANCEUR CLEAR 12/10/2018 0747   LABSPEC 1.005 12/10/2018 0747   PHURINE 6.0 12/10/2018 0747   GLUCOSEU NEGATIVE 12/10/2018 0747   HGBUR NEGATIVE 12/10/2018 0747   BILIRUBINUR  NEGATIVE 12/10/2018 0747   KETONESUR NEGATIVE 12/10/2018 0747   PROTEINUR NEGATIVE 12/10/2018 0747   NITRITE NEGATIVE 12/10/2018 0747   LEUKOCYTESUR NEGATIVE 12/10/2018 0747   Sepsis Labs Invalid input(s): PROCALCITONIN,  WBC,  LACTICIDVEN Microbiology Recent Results (from the past 240 hour(s))  SARS CORONAVIRUS 2 (TAT 6-24 HRS) Nasopharyngeal Nasopharyngeal Swab     Status: None   Collection Time: 12/10/18 10:56 PM   Specimen: Nasopharyngeal Swab  Result Value Ref Range Status   SARS Coronavirus 2 NEGATIVE NEGATIVE Final    Comment: (NOTE) SARS-CoV-2 target nucleic acids are NOT DETECTED. The SARS-CoV-2 RNA is generally detectable in upper and lower respiratory specimens during the acute phase of infection. Negative results do not preclude SARS-CoV-2 infection, do not rule out co-infections with other pathogens, and should not be used as the sole basis for treatment or other patient management decisions. Negative results must be combined with clinical observations, patient history, and epidemiological information. The expected result is Negative. Fact Sheet for Patients: HairSlick.nohttps://www.fda.gov/media/138098/download Fact Sheet for Healthcare  Providers: quierodirigir.com This test is not yet approved or cleared by the [place] and  has been authorized for detection and/or diagnosis of SARS-CoV-2 by FDA under an Emergency Use Authorization (EUA). This EUA will remain  in effect (meaning this test can be used) for the duration of the COVID-19 declaration under Section 56 4(b)(1) of the Act, 21 U.S.C. section 360bbb-3(b)(1), unless the authorization is terminated or revoked sooner. Performed at [facility], [address] [place], [place] [address]   Aerobic/Anaerobic Culture (surgical/deep wound)     Status: None (Preliminary result)   Collection Time: 12/11/18 11:58 AM   Specimen: Wound; Synovial Fluid  Result Value Ref Range Status    Specimen Description WOUND LEFT ILIOPSOAS BURSA  Final   Special Requests NONE  Final   Gram Stain   Final    RARE WBC PRESENT, PREDOMINANTLY PMN NO ORGANISMS SEEN    Culture   Final    NO GROWTH 1 DAY Performed at [facility], [address], [place], [place] [address]    Report Status PENDING  Incomplete     Time coordinating discharge: Over 30 minutes  SIGNED:   Burke Keels, MD  [facility] 12/12/2018, 1:22 PM Pager   If 7PM-7AM, please contact night-coverage [email or web address] Password [email or web address]

## 2018-12-12 NOTE — Progress Notes (Signed)
DISCHARGE NOTE HOME Isa Hitz to be discharged Home per MD order. Discussed prescriptions and follow up appointments with the patient. Prescriptions given to patient; medication list explained in detail. Patient verbalized understanding.  Skin clean, dry and intact without evidence of skin break down, no evidence of skin tears noted. IV catheter discontinued intact. Site without signs and symptoms of complications. Dressing and pressure applied. Pt denies pain at the site currently. No complaints noted.  Patient free of lines, drains, and wounds.   An After Visit Summary (AVS) was printed and given to the patient. Patient escorted via wheelchair, and discharged home via private auto.  Arlyss Repress, RN

## 2018-12-12 NOTE — Plan of Care (Signed)
  Problem: Education: Goal: Knowledge of General Education information will improve Description Including pain rating scale, medication(s)/side effects and non-pharmacologic comfort measures Outcome: Progressing   

## 2018-12-16 LAB — AEROBIC/ANAEROBIC CULTURE W GRAM STAIN (SURGICAL/DEEP WOUND): Culture: NO GROWTH

## 2020-07-01 ENCOUNTER — Other Ambulatory Visit: Payer: Self-pay | Admitting: Physician Assistant

## 2020-07-01 DIAGNOSIS — Z1231 Encounter for screening mammogram for malignant neoplasm of breast: Secondary | ICD-10-CM

## 2020-08-23 ENCOUNTER — Ambulatory Visit
Admission: RE | Admit: 2020-08-23 | Discharge: 2020-08-23 | Disposition: A | Discharge status: Home / Self Care | Payer: 59 | Source: Ambulatory Visit | Attending: Physician Assistant | Admitting: Physician Assistant

## 2020-08-23 ENCOUNTER — Other Ambulatory Visit: Payer: Self-pay

## 2020-08-23 DIAGNOSIS — Z1231 Encounter for screening mammogram for malignant neoplasm of breast: Secondary | ICD-10-CM

## 2020-08-27 ENCOUNTER — Other Ambulatory Visit: Payer: Self-pay | Admitting: Physician Assistant

## 2020-08-27 DIAGNOSIS — R928 Other abnormal and inconclusive findings on diagnostic imaging of breast: Secondary | ICD-10-CM

## 2020-09-27 ENCOUNTER — Ambulatory Visit
Admission: RE | Admit: 2020-09-27 | Discharge: 2020-09-27 | Disposition: A | Discharge status: Home / Self Care | Payer: 59 | Source: Ambulatory Visit | Attending: Physician Assistant | Admitting: Physician Assistant

## 2020-09-27 ENCOUNTER — Other Ambulatory Visit: Payer: Self-pay

## 2020-09-27 ENCOUNTER — Other Ambulatory Visit: Payer: Self-pay | Admitting: Physician Assistant

## 2020-09-27 ENCOUNTER — Ambulatory Visit: Payer: 59

## 2020-09-27 DIAGNOSIS — R928 Other abnormal and inconclusive findings on diagnostic imaging of breast: Secondary | ICD-10-CM

## 2020-10-04 ENCOUNTER — Ambulatory Visit
Admission: RE | Admit: 2020-10-04 | Discharge: 2020-10-04 | Disposition: A | Discharge status: Home / Self Care | Payer: 59 | Source: Ambulatory Visit | Attending: Physician Assistant | Admitting: Physician Assistant

## 2020-10-04 ENCOUNTER — Other Ambulatory Visit: Payer: Self-pay

## 2020-10-04 DIAGNOSIS — R928 Other abnormal and inconclusive findings on diagnostic imaging of breast: Secondary | ICD-10-CM

## 2022-02-19 IMAGING — MG MM BREAST BX W/ LOC DEV 1ST LESION IMAGE BX SPEC STEREO GUIDE*R*
3 of 9 series · 3 of 33 positions shown · non-contrast
Comparison: Previous exams.
COMPARISON: Previous exams.

Addendum:
CLINICAL DATA: 64-year-old female with indeterminate
calcifications.

EXAM:
RIGHT BREAST STEREOTACTIC CORE NEEDLE BIOPSY

[R (1 of 2)]
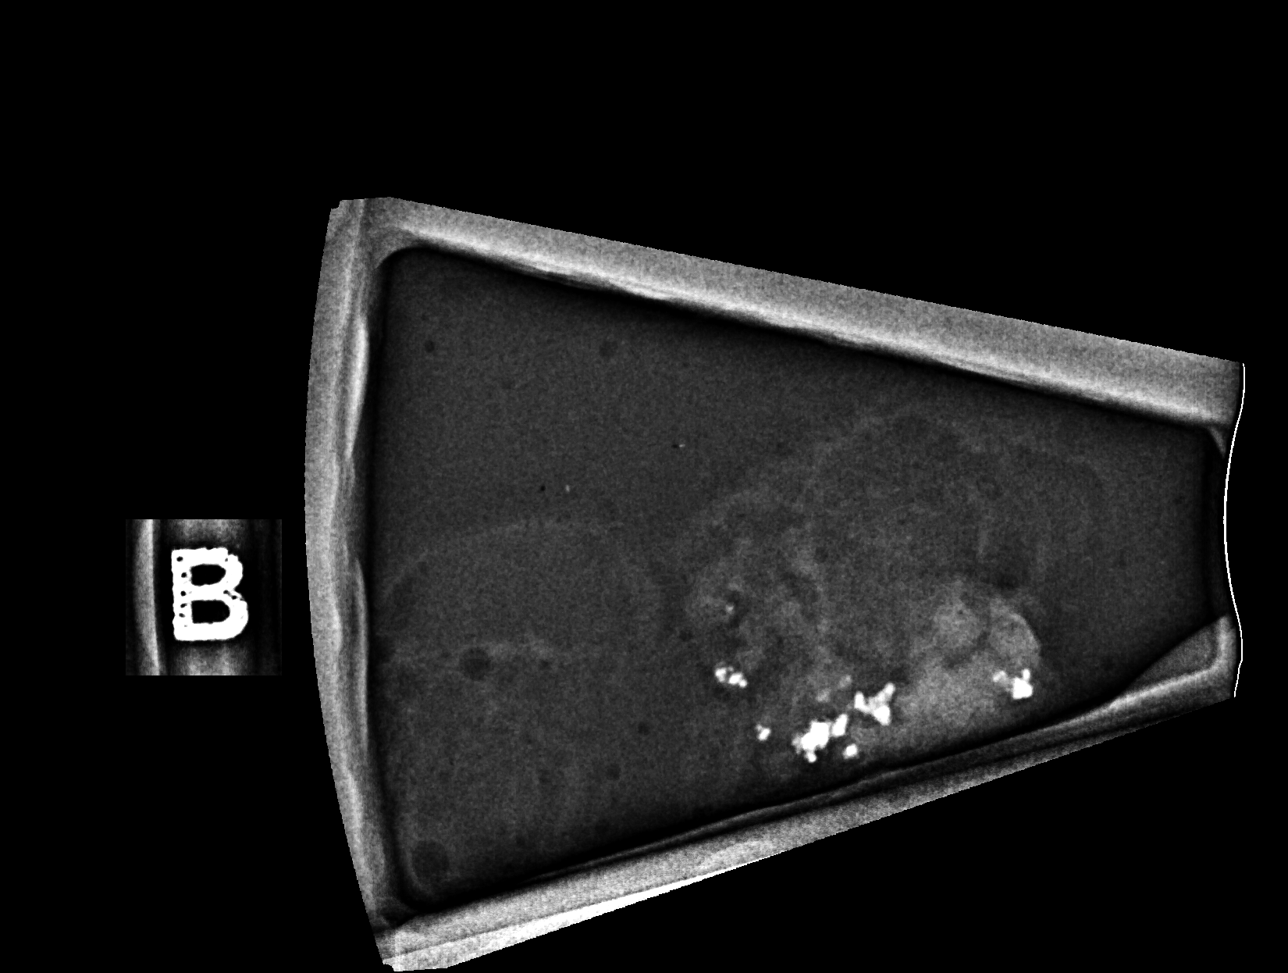

[R (2 of 2)]
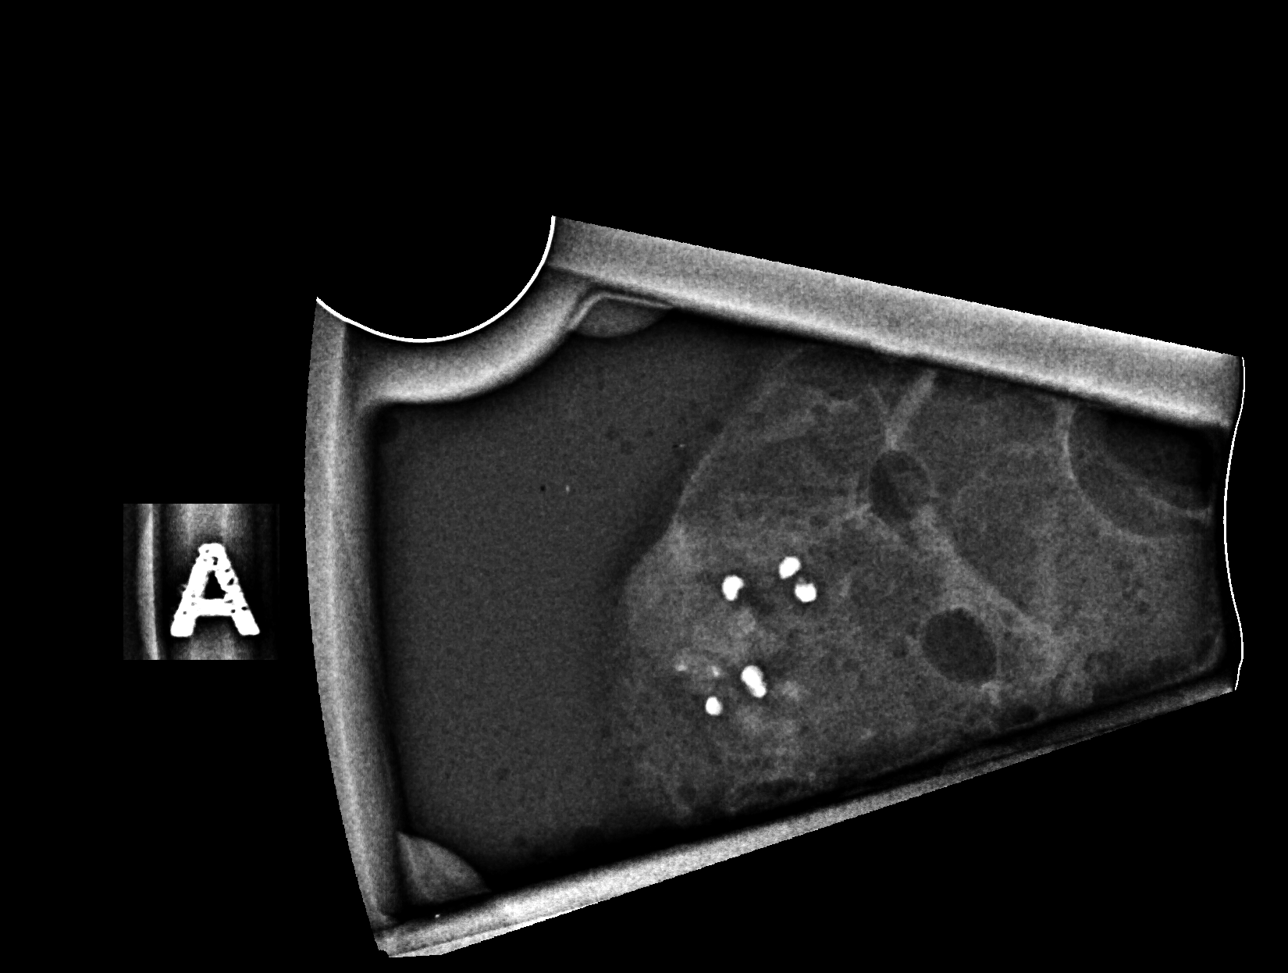

[R ML]
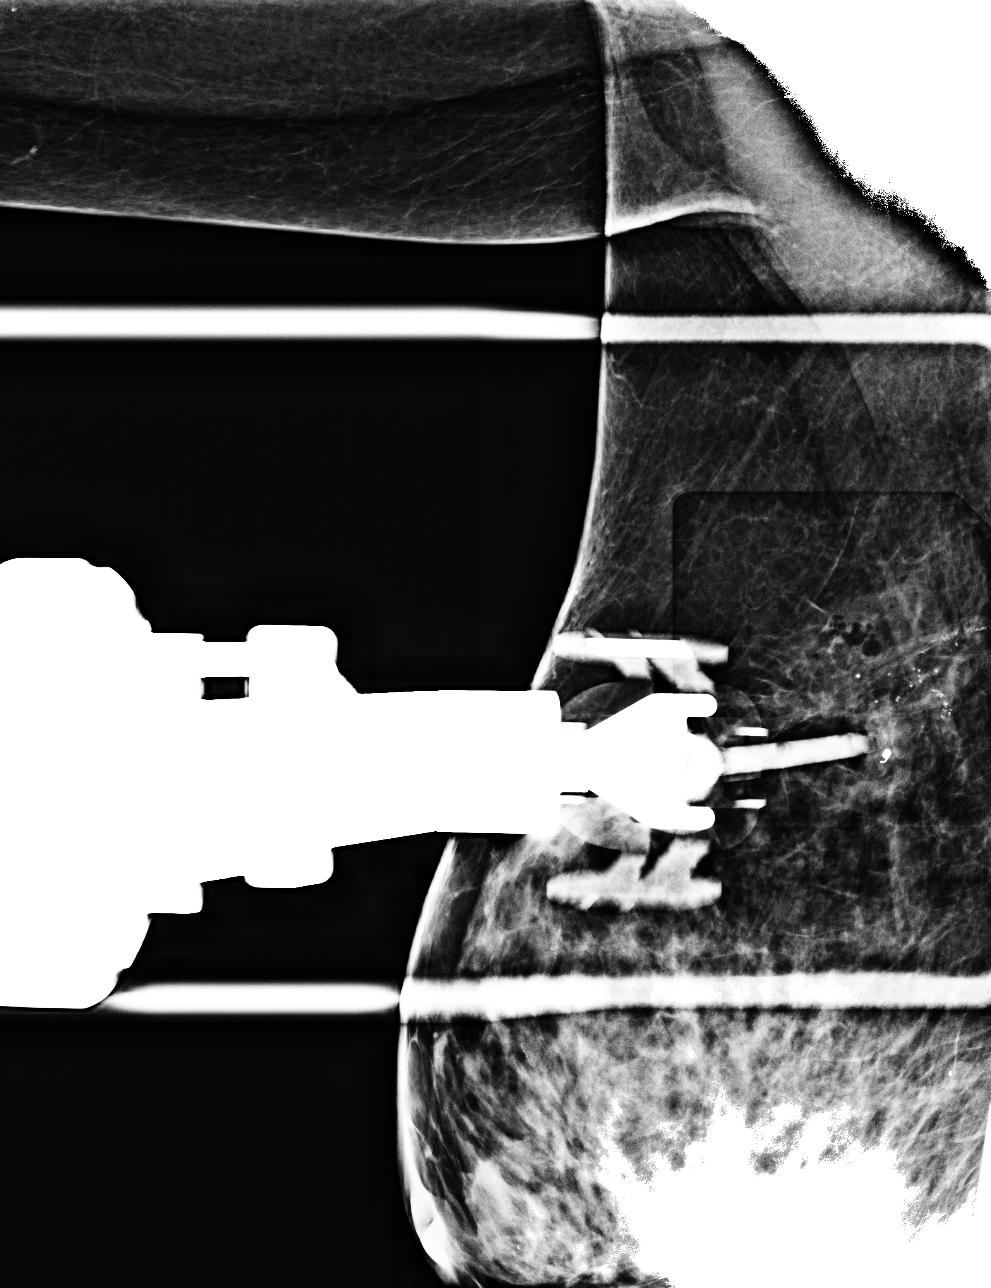

[3 of 33 positions shown; findings below may reference images not displayed]



Using sterile technique and 1% Lidocaine as local anesthetic, under
stereotactic guidance, a 9 gauge vacuum assisted device was used to
perform core needle biopsy of calcifications in the far posterior
upper inner right breast using a medial approach. Specimen
radiograph was performed showing calcifications within both
specimens. Specimens with calcifications are identified for
pathology.

Lesion quadrant: Upper inner quadrant

At the conclusion of the procedure, a coil shaped tissue marker clip
was deployed into the biopsy cavity. Follow-up 2-view mammogram was
performed and dictated separately.

The patient had difficulty tolerating the biopsy due to positioning
and far posterior location of the target calcifications.
Additionally, she had a moderate amount of bleeding after the biopsy
which subsided with external pressure. Given the difficulty with
today's biopsy and more coarse appearance of the contralateral
calcifications, the decision to postpone stereotactic biopsy of the
left breast was discussed with the patient.

If today's biopsy results demonstrate benign pathology, additional
six-month mammographic follow-up of the left breast should be
obtained.
IMPRESSION: Stereotactic-guided biopsy of the right breast. No apparent
complications.

ADDENDUM:
Pathology revealed FIBROADENOMATOID CHANGE WITH DYSTROPHIC
CALCIFICATIONS of the RIGHT breast, upper inner quadrant, posterior,
(coil clip). This was found to be concordant by Dr. Goulwen Bbl.

Pathology results were discussed with the patient by telephone by
patient reported doing well after the biopsy with tenderness at the
site. Post biopsy instructions and care were reviewed and questions
were answered. The patient was encouraged to call The [REDACTED]

The patient was asked to return for BILATERAL diagnostic mammography
and RIGHT breast ultrasound in 6 months and informed a reminder
notice would be sent regarding this appointment.

Pathology results reported by Don Lolito Onacram, RN on 10/07/2020.



Using sterile technique and 1% Lidocaine as local anesthetic, under
stereotactic guidance, a 9 gauge vacuum assisted device was used to
perform core needle biopsy of calcifications in the far posterior
upper inner right breast using a medial approach. Specimen
radiograph was performed showing calcifications within both
specimens. Specimens with calcifications are identified for
pathology.

Lesion quadrant: Upper inner quadrant

At the conclusion of the procedure, a coil shaped tissue marker clip
was deployed into the biopsy cavity. Follow-up 2-view mammogram was
performed and dictated separately.

The patient had difficulty tolerating the biopsy due to positioning
and far posterior location of the target calcifications.
Additionally, she had a moderate amount of bleeding after the biopsy
which subsided with external pressure. Given the difficulty with
today's biopsy and more coarse appearance of the contralateral
calcifications, the decision to postpone stereotactic biopsy of the
left breast was discussed with the patient.

If today's biopsy results demonstrate benign pathology, additional
six-month mammographic follow-up of the left breast should be
obtained.
IMPRESSION: Stereotactic-guided biopsy of the right breast. No apparent
complications.

## 2022-02-19 IMAGING — MG MM BREAST LOCALIZATION CLIP
4 series · 4 of 12 positions shown · non-contrast
Comparison: Previous exam(s).

CLINICAL DATA: Status post right breast stereotactic biopsy.

EXAM:
3D DIAGNOSTIC RIGHT MAMMOGRAM POST STEREOTACTIC BIOPSY

[R CC synth-2D]
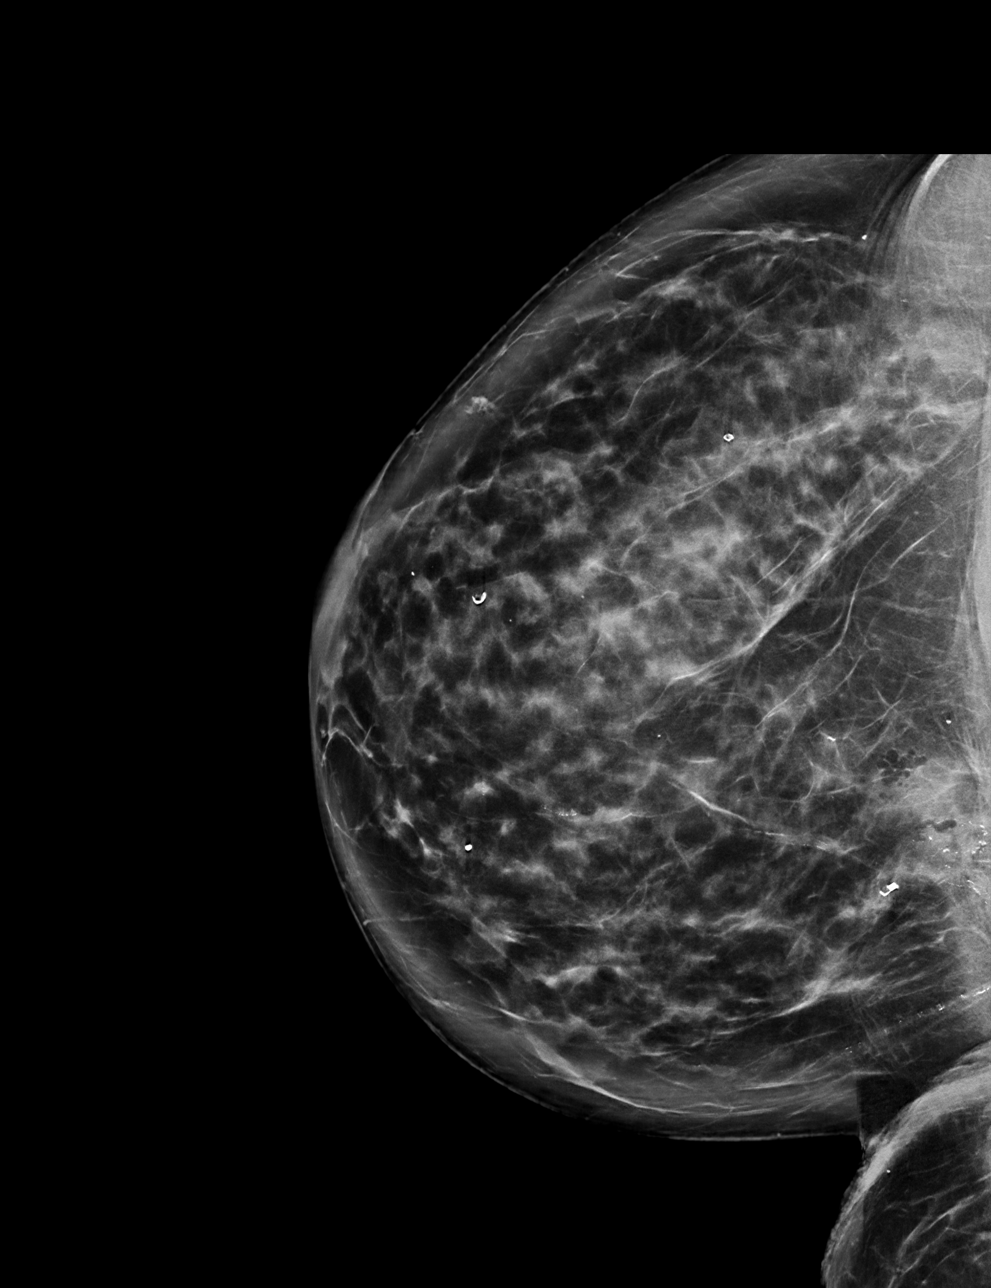

[R ML synth-2D]
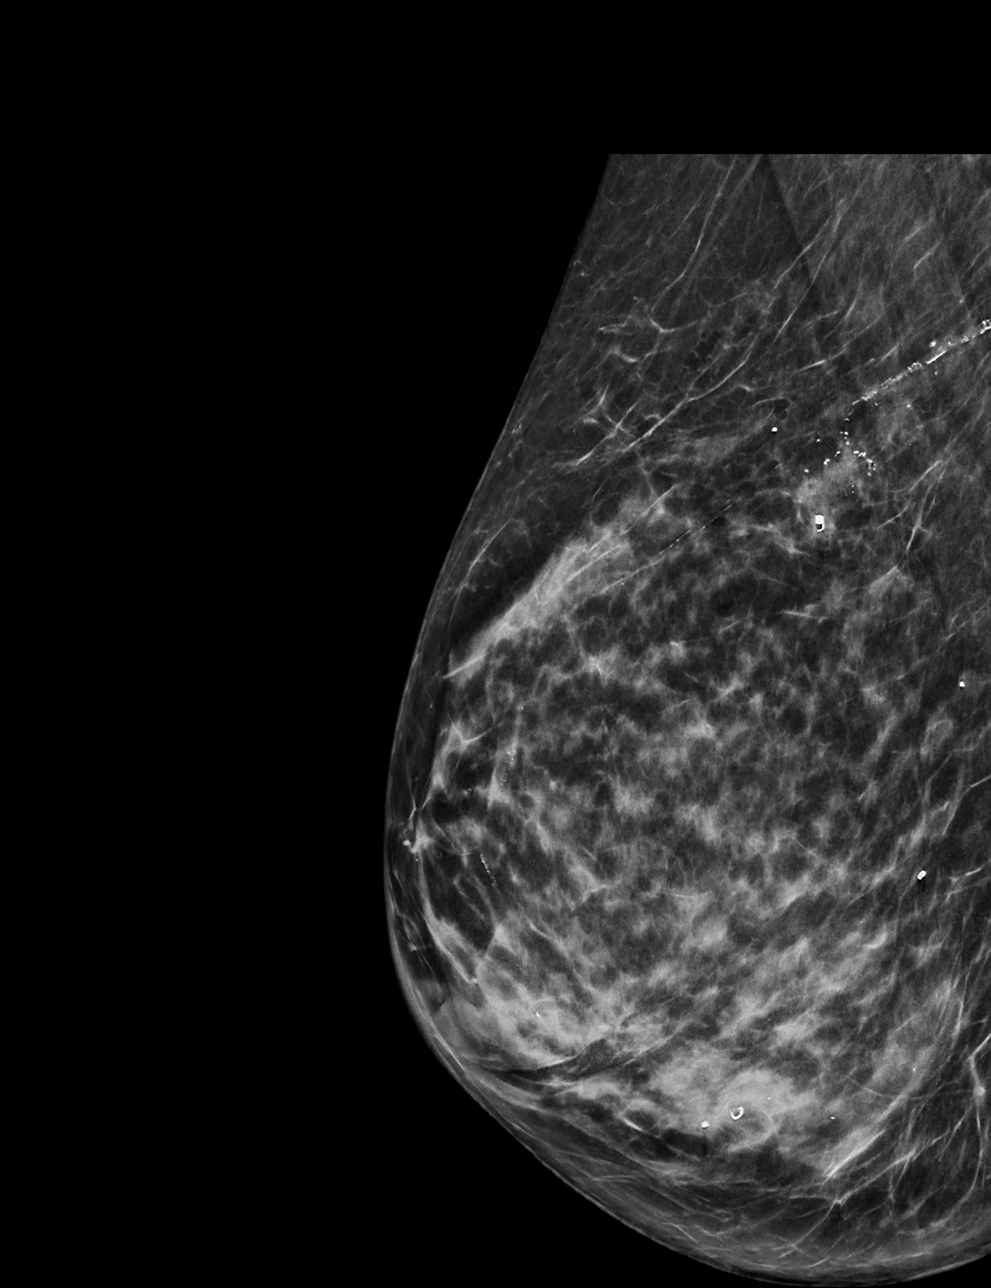

[R ML tomo · tomo slice 30/59.0]
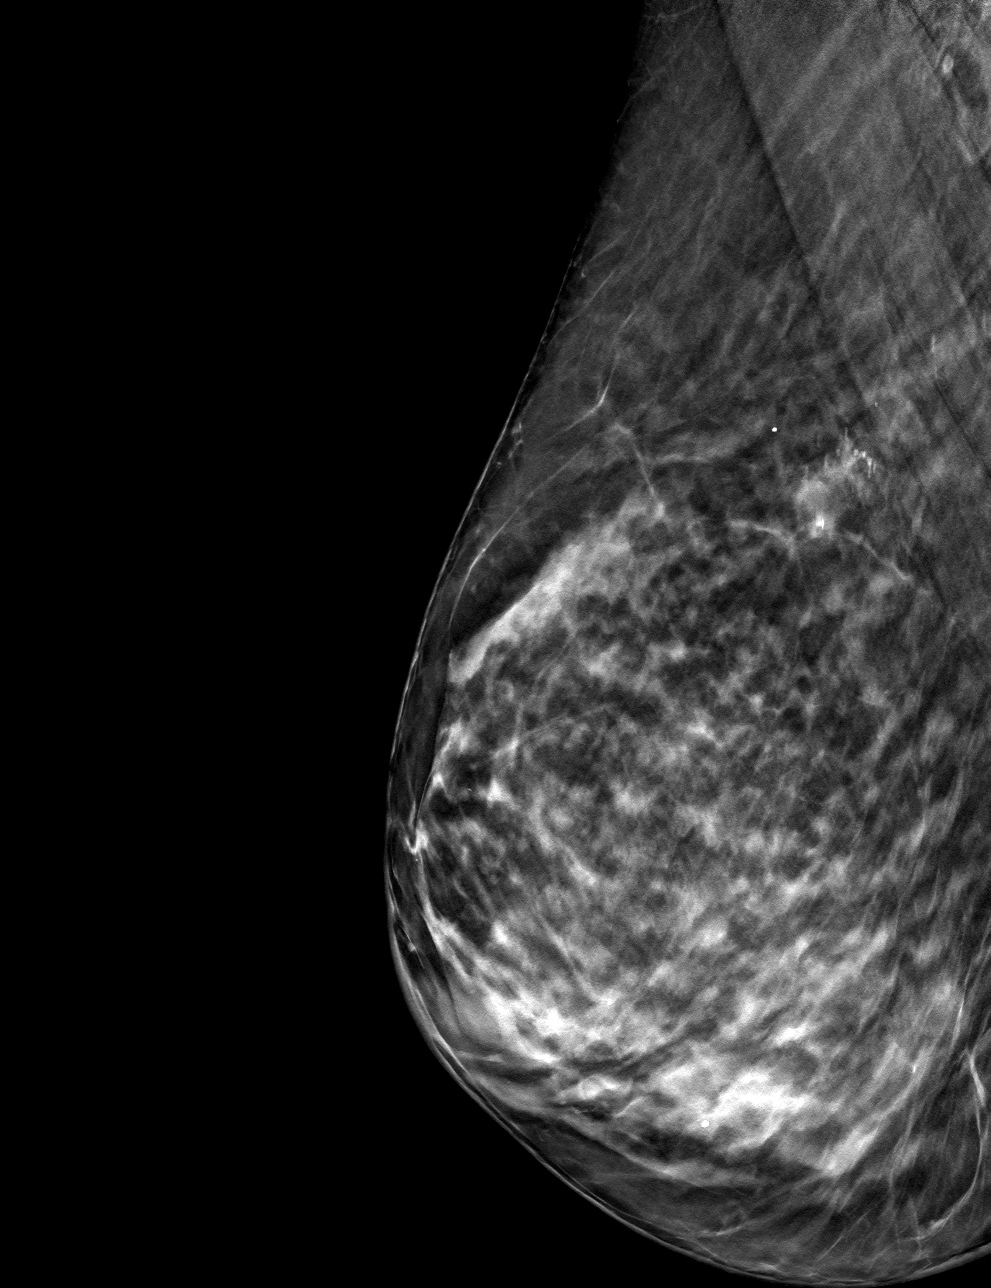

[R CC tomo · tomo slice 39/77.0]
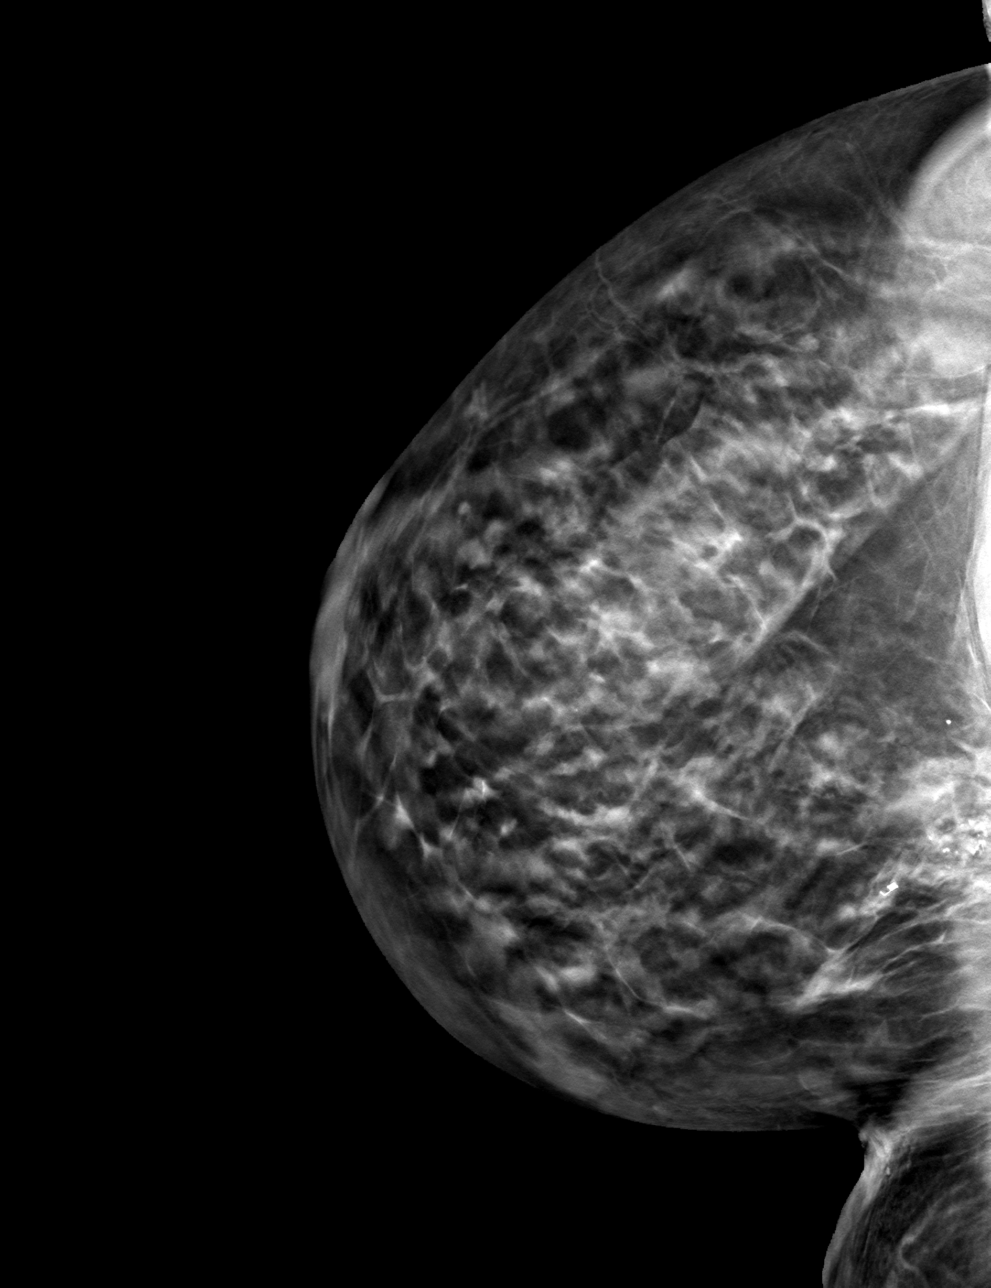

[4 of 12 positions shown; findings below may reference images not displayed]

FINDINGS: 3D Mammographic images were obtained following stereotactic guided
biopsy of the right breast. The biopsy marking clip is in expected
position at the site of biopsy.
IMPRESSION: Appropriate positioning of the coil shaped biopsy marking clip at
the site of biopsy in the upper inner right breast.

Final Assessment: Post Procedure Mammograms for Marker Placement
